# Patient Record
Sex: Female | Born: 1940 | Race: White | Hispanic: No | Marital: Single | Smoking: Former smoker
Health system: Southern US, Community
[De-identification: ages and names within clinical notes are randomized; demographics above are authoritative.]

## PROBLEM LIST (undated history)

## (undated) DIAGNOSIS — Z803 Family history of malignant neoplasm of breast: Secondary | ICD-10-CM

## (undated) DIAGNOSIS — J189 Pneumonia, unspecified organism: Secondary | ICD-10-CM

## (undated) DIAGNOSIS — Z170 Estrogen receptor positive status [ER+]: Principal | ICD-10-CM

## (undated) DIAGNOSIS — K219 Gastro-esophageal reflux disease without esophagitis: Secondary | ICD-10-CM

## (undated) DIAGNOSIS — M2550 Pain in unspecified joint: Secondary | ICD-10-CM

## (undated) DIAGNOSIS — F32A Depression, unspecified: Secondary | ICD-10-CM

## (undated) DIAGNOSIS — E785 Hyperlipidemia, unspecified: Secondary | ICD-10-CM

## (undated) DIAGNOSIS — M1990 Unspecified osteoarthritis, unspecified site: Secondary | ICD-10-CM

## (undated) DIAGNOSIS — H269 Unspecified cataract: Secondary | ICD-10-CM

## (undated) DIAGNOSIS — Z800 Family history of malignant neoplasm of digestive organs: Secondary | ICD-10-CM

## (undated) DIAGNOSIS — C50919 Malignant neoplasm of unspecified site of unspecified female breast: Secondary | ICD-10-CM

## (undated) DIAGNOSIS — Z8042 Family history of malignant neoplasm of prostate: Secondary | ICD-10-CM

## (undated) DIAGNOSIS — I10 Essential (primary) hypertension: Secondary | ICD-10-CM

## (undated) DIAGNOSIS — M549 Dorsalgia, unspecified: Secondary | ICD-10-CM

## (undated) DIAGNOSIS — C50412 Malignant neoplasm of upper-outer quadrant of left female breast: Principal | ICD-10-CM

## (undated) HISTORY — DX: Gastro-esophageal reflux disease without esophagitis: K21.9

## (undated) HISTORY — DX: Pneumonia, unspecified organism: J18.9

## (undated) HISTORY — PX: KIDNEY STONE SURGERY: SHX686

## (undated) HISTORY — DX: Estrogen receptor positive status (ER+): Z17.0

## (undated) HISTORY — PX: ABDOMINAL HYSTERECTOMY: SHX81

## (undated) HISTORY — DX: Unspecified osteoarthritis, unspecified site: M19.90

## (undated) HISTORY — DX: Pain in unspecified joint: M25.50

## (undated) HISTORY — DX: Unspecified cataract: H26.9

## (undated) HISTORY — DX: Family history of malignant neoplasm of prostate: Z80.42

## (undated) HISTORY — PX: TONSILLECTOMY: SUR1361

## (undated) HISTORY — PX: COLONOSCOPY: SHX174

## (undated) HISTORY — DX: Essential (primary) hypertension: I10

## (undated) HISTORY — PX: BREAST SURGERY: SHX581

## (undated) HISTORY — PX: AUGMENTATION MAMMAPLASTY: SUR837

## (undated) HISTORY — PX: BREAST LUMPECTOMY: SHX2

## (undated) HISTORY — DX: Hyperlipidemia, unspecified: E78.5

## (undated) HISTORY — DX: Family history of malignant neoplasm of digestive organs: Z80.0

## (undated) HISTORY — DX: Family history of malignant neoplasm of breast: Z80.3

## (undated) HISTORY — PX: UPPER GASTROINTESTINAL ENDOSCOPY: SHX188

## (undated) HISTORY — PX: EYE SURGERY: SHX253

## (undated) HISTORY — PX: BREAST EXCISIONAL BIOPSY: SUR124

## (undated) HISTORY — DX: Dorsalgia, unspecified: M54.9

## (undated) HISTORY — DX: Malignant neoplasm of upper-outer quadrant of left female breast: C50.412

## (undated) HISTORY — DX: Depression, unspecified: F32.A

## (undated) HISTORY — PX: POLYPECTOMY: SHX149

---

## 1978-06-05 HISTORY — PX: UTERINE FIBROID SURGERY: SHX826

## 2011-11-02 DIAGNOSIS — H35369 Drusen (degenerative) of macula, unspecified eye: Secondary | ICD-10-CM | POA: Diagnosis not present

## 2012-01-25 DIAGNOSIS — L723 Sebaceous cyst: Secondary | ICD-10-CM | POA: Diagnosis not present

## 2012-01-25 DIAGNOSIS — L820 Inflamed seborrheic keratosis: Secondary | ICD-10-CM | POA: Diagnosis not present

## 2012-01-25 DIAGNOSIS — L578 Other skin changes due to chronic exposure to nonionizing radiation: Secondary | ICD-10-CM | POA: Diagnosis not present

## 2012-01-25 DIAGNOSIS — D235 Other benign neoplasm of skin of trunk: Secondary | ICD-10-CM | POA: Diagnosis not present

## 2012-01-25 DIAGNOSIS — B359 Dermatophytosis, unspecified: Secondary | ICD-10-CM | POA: Diagnosis not present

## 2012-03-04 DIAGNOSIS — I10 Essential (primary) hypertension: Secondary | ICD-10-CM | POA: Diagnosis not present

## 2012-03-04 DIAGNOSIS — Z23 Encounter for immunization: Secondary | ICD-10-CM | POA: Diagnosis not present

## 2012-04-17 DIAGNOSIS — I10 Essential (primary) hypertension: Secondary | ICD-10-CM | POA: Diagnosis not present

## 2012-04-17 DIAGNOSIS — Z0289 Encounter for other administrative examinations: Secondary | ICD-10-CM | POA: Diagnosis not present

## 2012-04-17 LAB — LIPID PANEL
Cholesterol: 288 mg/dL — AB (ref 0–200)
HDL: 59 mg/dL (ref 35–70)
LDL Cholesterol: 217 mg/dL
Triglycerides: 62 mg/dL (ref 40–160)

## 2012-04-17 LAB — BASIC METABOLIC PANEL
BUN: 19 mg/dL (ref 4–21)
Creatinine: 0.8 mg/dL (ref 0.5–1.1)

## 2012-04-17 LAB — HEPATIC FUNCTION PANEL
ALT: 22 U/L (ref 7–35)
AST: 18 U/L (ref 13–35)

## 2012-05-14 ENCOUNTER — Ambulatory Visit (INDEPENDENT_AMBULATORY_CARE_PROVIDER_SITE_OTHER): Payer: Medicare Other | Admitting: Sports Medicine

## 2012-05-14 ENCOUNTER — Encounter: Payer: Self-pay | Admitting: Sports Medicine

## 2012-05-14 VITALS — BP 188/71 | HR 55 | Ht 64.0 in | Wt 215.0 lb

## 2012-05-14 DIAGNOSIS — Z299 Encounter for prophylactic measures, unspecified: Secondary | ICD-10-CM | POA: Insufficient documentation

## 2012-05-14 DIAGNOSIS — Z593 Problems related to living in residential institution: Secondary | ICD-10-CM | POA: Insufficient documentation

## 2012-05-14 DIAGNOSIS — E669 Obesity, unspecified: Secondary | ICD-10-CM | POA: Insufficient documentation

## 2012-05-14 DIAGNOSIS — E785 Hyperlipidemia, unspecified: Secondary | ICD-10-CM

## 2012-05-14 DIAGNOSIS — I10 Essential (primary) hypertension: Secondary | ICD-10-CM | POA: Insufficient documentation

## 2012-05-14 DIAGNOSIS — Z789 Other specified health status: Secondary | ICD-10-CM | POA: Insufficient documentation

## 2012-05-14 NOTE — Assessment & Plan Note (Signed)
Patient is up-to-date on colonoscopy, mammogram. Status post hysterectomy. Will come back for Medicare wellness exam.

## 2012-05-14 NOTE — Patient Instructions (Signed)
Madison Hardin I would like to make appointment for your wellness Medicare exam. I would also like to make another appointment at some point to discuss dietary and weight loss strategies.

## 2012-05-14 NOTE — Assessment & Plan Note (Signed)
Form was filled out. All blood work was already done, vaccinations done. She does need to come back 6 weeks prior to moving in for PPD.

## 2012-05-14 NOTE — Assessment & Plan Note (Signed)
Will discuss at future visits, she is open to all possibilities.

## 2012-05-14 NOTE — Progress Notes (Addendum)
Subjective:    CC: Establish care.   HPI:  Hypertension: Present chronically for a long time.  Usually well-controlled on telmisartan, denies headaches, chest pain, swelling, visual changes.  Domiciliary: She recently moved down from [place], is looking to move into a independent care facility.   Does need a form filled out. She also needs blood work, as well as a physical exam. Blood work has already been done, she will also need to come back 6 weeks before starting for a TB test.  Hyperlipidemia: She desires not to have this treated.  Obesity: Would like to discuss this at a future visit.  Past medical history, Surgical history, Family history, Social history, Allergies, and medications have been entered into the medical record, reviewed, and no changes needed.   Review of Systems: No headache, visual changes, nausea, vomiting, diarrhea, constipation, dizziness, abdominal pain, skin rash, fevers, chills, night sweats, swollen lymph nodes, weight loss, chest pain, body aches, joint swelling, muscle aches, shortness of breath, mood changes, visual or auditory hallucinations.  Objective:    General: Well Developed, obese, and in no acute distress.  Neuro: Alert and oriented x3, extra-ocular muscles intact.  HEENT: Normocephalic, atraumatic, pupils equal round reactive to light, neck supple, no masses, no lymphadenopathy, thyroid nonpalpable.  Skin: Warm and dry, no rashes noted.  Cardiac: Regular rate and rhythm, no murmurs rubs or gallops.  Respiratory: Clear to auscultation bilaterally. Not using accessory muscles, speaking in full sentences.  Abdominal: Soft, nontender, nondistended, positive bowel sounds, no masses, no organomegaly.  Musculoskeletal: Shoulder, elbow, wrist, hip, knee, ankle stable, and with full range of motion.  Form was filled out today.  Impression and Recommendations:    The patient was counselled, risk factors were discussed, anticipatory guidance given.

## 2012-05-14 NOTE — Assessment & Plan Note (Signed)
Blood pressure usually runs in the normal range on telmisartan. She will come back and we will recheck her blood pressure.

## 2012-05-14 NOTE — Assessment & Plan Note (Signed)
Patient desires that we do not treat her hyperlipidemia.

## 2012-06-06 ENCOUNTER — Encounter: Payer: Self-pay | Admitting: *Deleted

## 2012-06-06 ENCOUNTER — Encounter: Payer: Self-pay | Admitting: Sports Medicine

## 2012-06-06 ENCOUNTER — Ambulatory Visit (INDEPENDENT_AMBULATORY_CARE_PROVIDER_SITE_OTHER): Payer: Medicare Other | Admitting: Sports Medicine

## 2012-06-06 VITALS — BP 155/87 | HR 105 | Ht 64.0 in | Wt 213.0 lb

## 2012-06-06 DIAGNOSIS — L659 Nonscarring hair loss, unspecified: Secondary | ICD-10-CM | POA: Insufficient documentation

## 2012-06-06 DIAGNOSIS — Z0000 Encounter for general adult medical examination without abnormal findings: Secondary | ICD-10-CM | POA: Diagnosis not present

## 2012-06-06 DIAGNOSIS — Z299 Encounter for prophylactic measures, unspecified: Secondary | ICD-10-CM

## 2012-06-06 DIAGNOSIS — E785 Hyperlipidemia, unspecified: Secondary | ICD-10-CM

## 2012-06-06 DIAGNOSIS — E669 Obesity, unspecified: Secondary | ICD-10-CM

## 2012-06-06 DIAGNOSIS — I10 Essential (primary) hypertension: Secondary | ICD-10-CM

## 2012-06-06 NOTE — Progress Notes (Signed)
Subjective:    Madison Hardin is a 72 y.o. female who presents for Medicare Annual/Subsequent preventive examination.  Preventive Screening-Counseling & Management  Tobacco History  Smoking status  . Former Smoker  Smokeless tobacco  . Not on file     Problems Prior to Visit 1.   Current Problems (verified) Patient Active Problem List  Diagnosis  . Hypertension  . Hyperlipidemia  . Obesity  . Preventive measure  . Independent living facility as domicile    Medications Prior to Visit Current Outpatient Prescriptions on File Prior to Visit  Medication Sig Dispense Refill  . MICARDIS 40 MG tablet Take 20 mg by mouth daily.       Marland Kitchen omeprazole (PRILOSEC) 10 MG capsule Take 10 mg by mouth daily.        Current Medications (verified) Current Outpatient Prescriptions  Medication Sig Dispense Refill  . MICARDIS 40 MG tablet Take 20 mg by mouth daily.       Marland Kitchen omeprazole (PRILOSEC) 10 MG capsule Take 10 mg by mouth daily.         Allergies (verified) Review of patient's allergies indicates no known allergies.   PAST HISTORY  Family History Family History  Problem Relation Age of Onset  . Cancer Mother   . Cancer Father     Social History History  Substance Use Topics  . Smoking status: Former Games developer  . Smokeless tobacco: Not on file  . Alcohol Use: No     Are there smokers in your home (other than you)? No  Risk Factors Current exercise habits: Jogging in pool.  Dietary issues discussed: Yes   Cardiac risk factors: advanced age (older than 9 for men, 61 for women) and hypertension.  Depression Screen (Note: if answer to either of the following is "Yes", a more complete depression screening is indicated)   Over the past two weeks, have you felt down, depressed or hopeless? No  Over the past two weeks, have you felt little interest or pleasure in doing things? No  Have you lost interest or pleasure in daily life? No  Do you often feel hopeless? No  Do you  cry easily over simple problems? No  Activities of Daily Living In your present state of health, do you have any difficulty performing the following activities?:  Driving? No Managing money?  No Feeding yourself? No Getting from bed to chair? No Climbing a flight of stairs? No Preparing food and eating?: No Bathing or showering? No Getting dressed: No Getting to the toilet? No Using the toilet:No Moving around from place to place: No In the past year have you fallen or had a near fall?:No   Are you sexually active?  Yes  Do you have more than one partner?  No  Hearing Difficulties: No Do you often ask people to speak up or repeat themselves? No Do you experience ringing or noises in your ears? No Do you have difficulty understanding soft or whispered voices? No   Do you feel that you have a problem with memory? No  Do you often misplace items? No  Do you feel safe at home?  Yes  Cognitive Testing  Alert? Yes  Normal Appearance?Yes  Oriented to person? Yes  Place? Yes   Time? Yes  Recall of three objects?  Yes  Can perform simple calculations? Yes  Displays appropriate judgment?Yes  Can read the correct time from a watch face?Yes   Advanced Directives have been discussed with the patient? Yes, No intubation,  CPR.  List the Names of Other Physician/Practitioners you currently use: 1.    Indicate any recent Medical Services you may have received from other than [facility] providers in the past year (date may be approximate).  Immunization History  Administered Date(s) Administered  . Influenza Whole 02/04/2012  . Pneumococcal Polysaccharide 10/04/2006  . Td 02/04/2011    Screening Tests Health Maintenance  Topic Date Due  . Colonoscopy  07/06/1991  . Zostavax  07/05/2001  . Influenza Vaccine  02/03/2013  . Tetanus/tdap  02/03/2021  . Pneumococcal Polysaccharide Vaccine Age 65 And Over  Completed    All answers were reviewed with the patient and necessary referrals were  made:  Rodney Langton, MD   06/06/2012   History reviewed: allergies, current medications, past family history, past medical history, past social history, past surgical history and problem list  Review of Systems Pertinent items are noted in HPI.    Objective:     Vision by Snellen chart: right eye:20/20, left eye:20/20 with glasses in both eyes. Has ophtho.  Body mass index is 36.56 kg/(m^2). BP 155/87  Pulse 105  Ht 5\' 4"  (1.626 m)  Wt 213 lb (96.616 kg)  BMI 36.56 kg/m2  No exam performed today.      Assessment:     Healthy 72 year old female.     Plan:     During the course of the visit the patient was educated and counseled about appropriate screening and preventive services including:    Colorectal cancer screening  Nutrition counseling   Advanced directives: DNI/DNR  Diet review for nutrition referral? Yes ____  Not Indicated _x___   Patient Instructions (the written plan) was given to the patient.  Medicare Attestation I have personally reviewed: The patient's medical and social history Their use of alcohol, tobacco or illicit drugs Their current medications and supplements The patient's functional ability including ADLs,fall risks, home safety risks, cognitive, and hearing and visual impairment Diet and physical activities Evidence for depression or mood disorders  The patient's weight, height, BMI, and visual acuity have been recorded in the chart.  I have made referrals, counseling, and provided education to the patient based on review of the above and I have provided the patient with a written personalized care plan for preventive services.     Rodney Langton, MD   06/06/2012

## 2012-06-06 NOTE — Assessment & Plan Note (Addendum)
Patient desires not to treat this.

## 2012-06-06 NOTE — Assessment & Plan Note (Signed)
Will be needing a repeat colonoscopy in a year, we will go ahead and send the referral. Last colonoscopy was February 2010, also up-to-date on shingles vaccination done in April 2009.

## 2012-06-06 NOTE — Assessment & Plan Note (Signed)
Continue home exercise  

## 2012-06-06 NOTE — Assessment & Plan Note (Signed)
Home blood pressures are perfect. No changes in medication.

## 2012-06-06 NOTE — Assessment & Plan Note (Signed)
We'll try over-the-counter minoxidil.

## 2012-06-07 ENCOUNTER — Encounter: Payer: Self-pay | Admitting: Internal Medicine

## 2012-07-08 DIAGNOSIS — M779 Enthesopathy, unspecified: Secondary | ICD-10-CM | POA: Diagnosis not present

## 2012-07-08 DIAGNOSIS — M216X9 Other acquired deformities of unspecified foot: Secondary | ICD-10-CM | POA: Diagnosis not present

## 2012-07-08 DIAGNOSIS — M25473 Effusion, unspecified ankle: Secondary | ICD-10-CM | POA: Diagnosis not present

## 2012-07-08 DIAGNOSIS — M25476 Effusion, unspecified foot: Secondary | ICD-10-CM | POA: Diagnosis not present

## 2012-07-22 ENCOUNTER — Encounter: Payer: Medicare Other | Admitting: Internal Medicine

## 2012-10-29 ENCOUNTER — Encounter: Payer: Self-pay | Admitting: Internal Medicine

## 2012-12-11 DIAGNOSIS — H526 Other disorders of refraction: Secondary | ICD-10-CM | POA: Diagnosis not present

## 2012-12-11 DIAGNOSIS — H01009 Unspecified blepharitis unspecified eye, unspecified eyelid: Secondary | ICD-10-CM | POA: Diagnosis not present

## 2012-12-11 DIAGNOSIS — H2589 Other age-related cataract: Secondary | ICD-10-CM | POA: Diagnosis not present

## 2012-12-11 DIAGNOSIS — H47329 Drusen of optic disc, unspecified eye: Secondary | ICD-10-CM | POA: Diagnosis not present

## 2012-12-11 DIAGNOSIS — H353190 Nonexudative age-related macular degeneration, unspecified eye, stage unspecified: Secondary | ICD-10-CM | POA: Diagnosis not present

## 2012-12-25 ENCOUNTER — Ambulatory Visit (AMBULATORY_SURGERY_CENTER): Payer: Medicare Other | Admitting: *Deleted

## 2012-12-25 ENCOUNTER — Telehealth: Payer: Self-pay | Admitting: *Deleted

## 2012-12-25 VITALS — Ht 64.0 in | Wt 213.4 lb

## 2012-12-25 DIAGNOSIS — Z86010 Personal history of colonic polyps: Secondary | ICD-10-CM

## 2012-12-25 MED ORDER — MOVIPREP 100 G PO SOLR: 1.0000 | Freq: Once | ORAL | Status: DC

## 2012-12-25 NOTE — Progress Notes (Signed)
No egg or soy allergy. ewm No home 02 use. ewm Previous GI history with dr Madison Hardin in [place]. Pt states she has history of colon polyps. ewm No problems with past sedation. ewm Records release complete and to [facility] for pyrtle. ewm

## 2012-12-25 NOTE — Telephone Encounter (Signed)
Requested records from Dr. Vista Mink in [place].

## 2012-12-25 NOTE — Telephone Encounter (Signed)
Pt is scheduled for a colonoscopy on 01-08-13 at 900 am with Dr  Rhea Belton. She has had multiple colonoscopies in [place] with Dr Vista Mink and we need to obtain old records. Pt states she has history of colon polyps with last colon in 2010 she believes.  Records release complete and to [facility]. Marie pre visit

## 2013-01-06 ENCOUNTER — Encounter: Payer: Medicare Other | Admitting: Internal Medicine

## 2013-01-08 ENCOUNTER — Ambulatory Visit (AMBULATORY_SURGERY_CENTER): Payer: Medicare Other | Admitting: Internal Medicine

## 2013-01-08 ENCOUNTER — Encounter: Payer: Self-pay | Admitting: Internal Medicine

## 2013-01-08 VITALS — BP 125/72 | HR 58 | Temp 98.2°F | Resp 26 | Ht 64.0 in | Wt 213.0 lb

## 2013-01-08 DIAGNOSIS — Z1211 Encounter for screening for malignant neoplasm of colon: Secondary | ICD-10-CM

## 2013-01-08 DIAGNOSIS — D126 Benign neoplasm of colon, unspecified: Secondary | ICD-10-CM

## 2013-01-08 DIAGNOSIS — Z86010 Personal history of colonic polyps: Secondary | ICD-10-CM | POA: Diagnosis not present

## 2013-01-08 DIAGNOSIS — K219 Gastro-esophageal reflux disease without esophagitis: Secondary | ICD-10-CM | POA: Diagnosis not present

## 2013-01-08 DIAGNOSIS — I10 Essential (primary) hypertension: Secondary | ICD-10-CM | POA: Diagnosis not present

## 2013-01-08 DIAGNOSIS — E669 Obesity, unspecified: Secondary | ICD-10-CM | POA: Diagnosis not present

## 2013-01-08 MED ORDER — SODIUM CHLORIDE 0.9 % IV SOLN: 500.0000 mL | INTRAVENOUS | Status: DC

## 2013-01-08 NOTE — Patient Instructions (Addendum)

## 2013-01-08 NOTE — Op Note (Signed)
[facility] [address] [place], [address]   COLONOSCOPY PROCEDURE REPORT  PATIENT: Madison, Hardin  MR#: [id number] BIRTHDATE: 1941/04/18 , 72  yrs. old GENDER: Female ENDOSCOPIST: Beverley Fiedler, MD REFERRED BY: Monica Becton PROCEDURE DATE:  01/08/2013 PROCEDURE:   Colonoscopy with snare polypectomy First Screening Colonoscopy - Avg.  risk and is 50 yrs.  old or older - No.  Prior Negative Screening - Now for repeat screening. N/A  History of Adenoma - Now for follow-up colonoscopy & has been > or = to 3 yrs.  Yes hx of adenoma.  Has been 3 or more years since last colonoscopy.  Polyps Removed Today? Yes. ASA CLASS:   Class II INDICATIONS:elevated risk screening. MEDICATIONS: MAC sedation, administered by CRNA and propofol (Diprivan) 300mg  IV  DESCRIPTION OF PROCEDURE:   After the risks benefits and alternatives of the procedure were thoroughly explained, informed consent was obtained.  A digital rectal exam revealed no rectal mass.   The LB PFC-H190 [id number]  endoscope was introduced through the anus and advanced to the cecum, which was identified by both the appendix and ileocecal valve. No adverse events experienced. The quality of the prep was good, using MoviPrep  The instrument was then slowly withdrawn as the colon was fully examined.   COLON FINDINGS: A sessile polyp measuring 4 mm in size was found in the transverse colon.  A polypectomy was performed with a cold snare.  The resection was complete and the polyp tissue was completely retrieved.   A pedunculated polyp measuring 6 mm in size was found in the rectosigmoid colon.  A polypectomy was performed using snare cautery.  The resection was complete and the polyp tissue was completely retrieved. Retroflexed views revealed external hemorrhoids. The time to cecum=6 minutes 00 seconds. Withdrawal time=14 minutes 05 seconds.  The scope was withdrawn and the procedure completed. COMPLICATIONS:  There were no complications.  ENDOSCOPIC IMPRESSION: 1.   Sessile polyp measuring 4 mm in size was found in the transverse colon; polypectomy was performed with a cold snare 2.   Pedunculated polyp measuring 6 mm in size was found in the rectosigmoid colon; polypectomy was performed using snare cautery 3.   Moderate sized external hemorrhoids  RECOMMENDATIONS: 1.  Hold aspirin, aspirin products, and anti-inflammatory medication for 1 week. 2.  Await pathology results 3.  Repeat Colonoscopy in 5 years. 4.  You will receive a letter within 1-2 weeks with the results of your biopsy as well as final recommendations.  Please call my office if you have not received a letter after 3 weeks.   eSigned:  Beverley Fiedler, MD 01/08/2013 9:39 AM   cc: The Patient; Monica Becton    PATIENT NAME:  Madison, Hardin MR#: [id number]

## 2013-01-08 NOTE — Progress Notes (Signed)
Patient did not experience any of the following events: a burn prior to discharge; a fall within the facility; wrong site/side/patient/procedure/implant event; or a hospital transfer or hospital admission upon discharge from the facility. (G8907) Patient did not have preoperative order for IV antibiotic SSI prophylaxis. (G8918)  

## 2013-01-08 NOTE — Progress Notes (Signed)
NO EGG OR SOY ALLERGY. EWM 

## 2013-01-08 NOTE — Progress Notes (Signed)
Called to room to assist during endoscopic procedure.  Patient ID and intended procedure confirmed with present staff. Received instructions for my participation in the procedure from the performing physician. ewm 

## 2013-01-08 NOTE — Progress Notes (Signed)
Lidocaine-40mg IV prior to Propofol InductionPropofol given over incremental dosages 

## 2013-01-10 ENCOUNTER — Telehealth: Payer: Self-pay | Admitting: *Deleted

## 2013-01-10 NOTE — Telephone Encounter (Signed)
Left message

## 2013-01-14 ENCOUNTER — Encounter: Payer: Self-pay | Admitting: Internal Medicine

## 2013-03-03 DIAGNOSIS — Z23 Encounter for immunization: Secondary | ICD-10-CM | POA: Diagnosis not present

## 2013-07-10 ENCOUNTER — Telehealth: Payer: Self-pay

## 2013-07-10 ENCOUNTER — Encounter: Payer: Self-pay | Admitting: Sports Medicine

## 2013-07-10 ENCOUNTER — Ambulatory Visit (INDEPENDENT_AMBULATORY_CARE_PROVIDER_SITE_OTHER): Payer: Medicare Other | Admitting: Sports Medicine

## 2013-07-10 VITALS — BP 133/76 | HR 84 | Temp 97.9°F | Ht 64.0 in | Wt 217.0 lb

## 2013-07-10 DIAGNOSIS — I10 Essential (primary) hypertension: Secondary | ICD-10-CM | POA: Diagnosis not present

## 2013-07-10 DIAGNOSIS — N63 Unspecified lump in unspecified breast: Secondary | ICD-10-CM | POA: Diagnosis not present

## 2013-07-10 DIAGNOSIS — N6320 Unspecified lump in the left breast, unspecified quadrant: Secondary | ICD-10-CM

## 2013-07-10 DIAGNOSIS — D171 Benign lipomatous neoplasm of skin and subcutaneous tissue of trunk: Secondary | ICD-10-CM | POA: Insufficient documentation

## 2013-07-10 DIAGNOSIS — Z299 Encounter for prophylactic measures, unspecified: Secondary | ICD-10-CM

## 2013-07-10 DIAGNOSIS — Z1239 Encounter for other screening for malignant neoplasm of breast: Secondary | ICD-10-CM

## 2013-07-10 DIAGNOSIS — N644 Mastodynia: Secondary | ICD-10-CM

## 2013-07-10 MED ORDER — OMEPRAZOLE 20 MG PO CPDR: 20.0000 mg | DELAYED_RELEASE_CAPSULE | Freq: Every day | ORAL | Status: DC

## 2013-07-10 MED ORDER — TELMISARTAN 20 MG PO TABS: 20.0000 mg | ORAL_TABLET | Freq: Every day | ORAL | Status: DC

## 2013-07-10 NOTE — Progress Notes (Signed)
  Subjective:    CC: Followup  HPI: Breast pain: Present for several days now, she has noted a nodule in the left breast, this is tender to palpation, no trauma, no changes in activity, no constitutional symptoms, pain is moderate, persistent, she does not desire any analgesic medication.  Hypertension: Needs refills on medication. Stable.  GERD: Needs refill of omeprazole, stable.  Past medical history, Surgical history, Family history not pertinant except as noted below, Social history, Allergies, and medications have been entered into the medical record, reviewed, and no changes needed.   Review of Systems: No fevers, chills, night sweats, weight loss, chest pain, or shortness of breath.   Objective:    General: Well Developed, well nourished, and in no acute distress.  Neuro: Alert and oriented x3, extra-ocular muscles intact, sensation grossly intact.  HEENT: Normocephalic, atraumatic, pupils equal round reactive to light, neck supple, no masses, no lymphadenopathy, thyroid nonpalpable.  Skin: Warm and dry, no rashes. Cardiac: Regular rate and rhythm, no murmurs rubs or gallops, no lower extremity edema.  Respiratory: Clear to auscultation bilaterally. Not using accessory muscles, speaking in full sentences. Breasts: Unremarkable to inspection, there is a movable, well defined, spherical mass in the left breast at the 8:00 position approximately 3 cm from the nipple. This is tender to palpation. No overlying erythema or induration. Left Shoulder: Inspection reveals no abnormalities, atrophy or asymmetry. Palpation is normal with no tenderness over AC joint or bicipital groove. ROM is full in all planes. Rotator cuff strength normal throughout. No signs of impingement with negative Neer and Hawkin's tests, empty can sign. Speeds and Yergason's tests normal. No labral pathology noted with negative Obrien's, negative clunk and good stability. Normal scapular function observed. No  painful arc and no drop arm sign. No apprehension sign  Physical exam was conducted with a female nurse chaperone.  Impression and Recommendations:

## 2013-07-10 NOTE — Telephone Encounter (Signed)
Helen from Imaging called stated that they don't  Do the breast imaging and [place] downstairs so she  changed it to [facility] and she stated that they will contact the patient to let her know. Sion Reinders,CMA

## 2013-07-10 NOTE — Telephone Encounter (Signed)
Noted, thanks!

## 2013-07-10 NOTE — Telephone Encounter (Signed)
Orders placed.

## 2013-07-10 NOTE — Assessment & Plan Note (Signed)
Refilling

## 2013-07-10 NOTE — Assessment & Plan Note (Signed)
Patient will come back for complete physical once we get this breast lump figured out.

## 2013-07-10 NOTE — Assessment & Plan Note (Signed)
This is approximately 1.5 cm, well-circumscribed, and tender, is most likely represents fibrocystic changes. At this point we are going to obtain a diagnostic mammogram and likely possible ultrasound. She declines any analgesics.

## 2013-07-10 NOTE — Telephone Encounter (Signed)
Patient called and stated that she has not had a mammogram done in a while and [facility] can she get an order for Bilateral Acequia

## 2013-07-11 NOTE — Telephone Encounter (Signed)
Patient has been notified. Prudy Candy,CMA  

## 2013-07-17 ENCOUNTER — Encounter: Payer: Self-pay | Admitting: Sports Medicine

## 2013-07-17 ENCOUNTER — Ambulatory Visit (INDEPENDENT_AMBULATORY_CARE_PROVIDER_SITE_OTHER): Payer: Medicare Other | Admitting: Sports Medicine

## 2013-07-17 VITALS — BP 138/81 | HR 89 | Ht 64.0 in | Wt 214.0 lb

## 2013-07-17 DIAGNOSIS — Z79899 Other long term (current) drug therapy: Secondary | ICD-10-CM

## 2013-07-17 DIAGNOSIS — M1990 Unspecified osteoarthritis, unspecified site: Secondary | ICD-10-CM | POA: Insufficient documentation

## 2013-07-17 DIAGNOSIS — Z0000 Encounter for general adult medical examination without abnormal findings: Secondary | ICD-10-CM | POA: Diagnosis not present

## 2013-07-17 DIAGNOSIS — Z136 Encounter for screening for cardiovascular disorders: Secondary | ICD-10-CM

## 2013-07-17 DIAGNOSIS — Z299 Encounter for prophylactic measures, unspecified: Secondary | ICD-10-CM

## 2013-07-17 DIAGNOSIS — G4700 Insomnia, unspecified: Secondary | ICD-10-CM | POA: Insufficient documentation

## 2013-07-17 DIAGNOSIS — L659 Nonscarring hair loss, unspecified: Secondary | ICD-10-CM

## 2013-07-17 DIAGNOSIS — N6320 Unspecified lump in the left breast, unspecified quadrant: Secondary | ICD-10-CM

## 2013-07-17 DIAGNOSIS — I10 Essential (primary) hypertension: Secondary | ICD-10-CM

## 2013-07-17 DIAGNOSIS — M949 Disorder of cartilage, unspecified: Secondary | ICD-10-CM

## 2013-07-17 DIAGNOSIS — M899 Disorder of bone, unspecified: Secondary | ICD-10-CM

## 2013-07-17 MED ORDER — TRAZODONE HCL 50 MG PO TABS: 50.0000 mg | ORAL_TABLET | Freq: Every day | ORAL | Status: DC

## 2013-07-17 NOTE — Assessment & Plan Note (Signed)
Currently scheduled for diagnostic mammogram and likely ultrasound as well.

## 2013-07-17 NOTE — Assessment & Plan Note (Signed)
Return for custom orthotics 

## 2013-07-17 NOTE — Assessment & Plan Note (Signed)
Well controlled 

## 2013-07-17 NOTE — Assessment & Plan Note (Signed)
Medicare physical today. Return to see me in one year for subsequent Medicare physical.

## 2013-07-17 NOTE — Progress Notes (Signed)
Subjective:    Madison Hardin is a 73 y.o. female who presents for Medicare Annual/Subsequent preventive examination.  Insomnia: Sleep latency is normal, quality of sleep, significant difficulty staying asleep for more than 5 hours per night, does not wake up to urinate.  Hair loss: No improvement with minoxidil, desires dermatology referral.  Preventive Screening-Counseling & Management  Tobacco History  Smoking status  . Former Smoker  Smokeless tobacco  . Never Used     Problems Prior to Visit 1.   Current Problems (verified) Patient Active Problem List   Diagnosis Date Noted  . Mass of breast, left 07/10/2013  . Hair loss 06/06/2012  . Hypertension 05/14/2012  . Hyperlipidemia 05/14/2012  . Obesity 05/14/2012  . Preventive measure 05/14/2012  . Independent living facility as domicile 05/14/2012    Medications Prior to Visit Current Outpatient Prescriptions on File Prior to Visit  Medication Sig Dispense Refill  . b complex vitamins capsule Take 1 capsule by mouth daily.      . Cholecalciferol (VITAMIN D) 2000 UNITS CAPS Take 1 capsule by mouth daily.      Marland Kitchen Kelp 225 MCG TABS Take 1 tablet by mouth daily.      . Lutein 20 MG TABS Take 1 tablet by mouth daily.      . magnesium oxide (MAG-OX) 400 MG tablet Take 400 mg by mouth daily.      . Menaquinone-7 (VITAMIN K2) 100 MCG CAPS Take 1 capsule by mouth daily.      . minoxidil (ROGAINE) 2 % external solution Apply topically 2 (two) times daily.      . Omega-3 Fatty Acids (OMEGA III EPA+DHA PO) Take 1 each by mouth daily. 1500mg /750 mg and pt takes 1 1/2 teaspoonsful daily      . omeprazole (PRILOSEC) 20 MG capsule Take 1 capsule (20 mg total) by mouth daily.  90 capsule  3  . telmisartan (MICARDIS) 20 MG tablet Take 1 tablet (20 mg total) by mouth daily.  90 tablet  3  . zinc gluconate 50 MG tablet Take 50 mg by mouth daily.       No current facility-administered medications on file prior to visit.    Current  Medications (verified) Current Outpatient Prescriptions  Medication Sig Dispense Refill  . b complex vitamins capsule Take 1 capsule by mouth daily.      . Cholecalciferol (VITAMIN D) 2000 UNITS CAPS Take 1 capsule by mouth daily.      Marland Kitchen Kelp 225 MCG TABS Take 1 tablet by mouth daily.      . Lutein 20 MG TABS Take 1 tablet by mouth daily.      . magnesium oxide (MAG-OX) 400 MG tablet Take 400 mg by mouth daily.      . Menaquinone-7 (VITAMIN K2) 100 MCG CAPS Take 1 capsule by mouth daily.      . minoxidil (ROGAINE) 2 % external solution Apply topically 2 (two) times daily.      . Omega-3 Fatty Acids (OMEGA III EPA+DHA PO) Take 1 each by mouth daily. 1500mg /750 mg and pt takes 1 1/2 teaspoonsful daily      . omeprazole (PRILOSEC) 20 MG capsule Take 1 capsule (20 mg total) by mouth daily.  90 capsule  3  . telmisartan (MICARDIS) 20 MG tablet Take 1 tablet (20 mg total) by mouth daily.  90 tablet  3  . zinc gluconate 50 MG tablet Take 50 mg by mouth daily.       No current  facility-administered medications for this visit.     Allergies (verified) Review of patient's allergies indicates no known allergies.   PAST HISTORY  Family History Family History  Problem Relation Age of Onset  . Breast cancer Mother   . Prostate cancer Father   . Penile cancer Father   . Colon cancer Neg Hx   . Rectal cancer Neg Hx   . Stomach cancer Neg Hx   . Leukemia Paternal Grandmother     Social History History  Substance Use Topics  . Smoking status: Former Research scientist (life sciences)  . Smokeless tobacco: Never Used  . Alcohol Use: No     Are there smokers in your home (other than you)? No  Risk Factors Current exercise habits: The patient does not participate in regular exercise at present.  Dietary issues discussed: Yes   Cardiac risk factors: advanced age (older than 24 for men, 43 for women), dyslipidemia and hypertension.  Depression Screen (Note: if answer to either of the following is "Yes", a more  complete depression screening is indicated)   Over the past two weeks, have you felt down, depressed or hopeless? No  Over the past two weeks, have you felt little interest or pleasure in doing things? No  Have you lost interest or pleasure in daily life? No  Do you often feel hopeless? No  Do you cry easily over simple problems? No  Activities of Daily Living In your present state of health, do you have any difficulty performing the following activities?:  Driving? No Managing money?  No Feeding yourself? No Getting from bed to chair? NoNo exam performed today, No need. Climbing a flight of stairs? No Preparing food and eating?: No Bathing or showering? No Getting dressed: No Getting to the toilet? No Using the toilet:No Moving around from place to place: No In the past year have you fallen or had a near fall?:No   Are you sexually active?  No  Do you have more than one partner?  No  Hearing Difficulties: No Do you often ask people to speak up or repeat themselves? No Do you experience ringing or noises in your ears? No Do you have difficulty understanding soft or whispered voices? No   Do you feel that you have a problem with memory? No  Do you often misplace items? No  Do you feel safe at home?  Yes  Cognitive Testing  Alert? Yes  Normal Appearance?Yes  Oriented to person? Yes  Place? Yes   Time? Yes  Recall of three objects?  Yes  Can perform simple calculations? Yes  Displays appropriate judgment?Yes  Can read the correct time from a watch face?Yes   Advanced Directives have been discussed with the patient? Yes  List the Names of Other Physician/Practitioners you currently use: 1.    Indicate any recent Medical Services you may have received from other than [facility] providers in the past year (date may be approximate).  Immunization History  Administered Date(s) Administered  . Influenza Whole 02/04/2012, 03/03/2013  . Pneumococcal Polysaccharide-23 10/04/2006   . Td 02/04/2011    Screening Tests Health Maintenance  Topic Date Due  . Mammogram  07/06/1991  . Influenza Vaccine  01/03/2014  . Colonoscopy  01/08/2018  . Tetanus/tdap  02/03/2021  . Pneumococcal Polysaccharide Vaccine Age 87 And Over  Completed  . Zostavax  Completed    All answers were reviewed with the patient and necessary referrals were made:  Aundria Mems, MD   07/17/2013   History reviewed:  allergies, current medications, past family history, past medical history, past social history, past surgical history and problem list  Review of Systems Pertinent items are noted in HPI.    Objective:     Vision by Snellen chart: right eye:20/20, left eye:20/20  Body mass index is 36.72 kg/(m^2). BP 138/81  Pulse 89  Ht 5\' 4"  (1.626 m)  Wt 214 lb (97.07 kg)  BMI 36.72 kg/m2  General Appearance: Alert, well developed and well nourished, cooperative, no distress.  Head: Normocephalic, no obvious abnormality  Eyes: PERRL, EOM's intact, conjunctiva and corneas clear.  Nose: Nares symmetrical, septum midline, mucosa pink, clear watery discharge; no sinus tenderness  Throat: Lips, tongue, and mucosa are moist, pink, and intact; teeth intact  Neck: Supple, symmetrical, trachea midline, no adenopathy; thyroid: no enlargement, symmetric,no tenderness/mass/nodules; no carotid bruit, no JVD  Back: Symmetrical, no curvature, ROM normal, no CVA tenderness  Chest/Breast: No mass or tenderness  Lungs: Clear to auscultation bilaterally, respirations unlabored  Heart: Normal PMI, no lower extremity edema, regular rate & rhythm, S1 and S2 normal, no murmurs, rubs, or gallops. Abdomen: Soft, non-tender, bowel sounds active all four quadrants, no mass, or organomegaly  Musculoskeletal: All joints, extremities examined, non-tender and unremarkable.  Lymphatic: No adenopathy  Skin/Hair/Nails: Skin warm, dry, and intact, no rashes or abnormal dyspigmentation  Neurologic: Alert and  oriented x3, no cranial nerve deficits, normal strength and tone, gait steady   Twelve-lead EKG was reviewed, shows normal sinus rhythm with incomplete right bundle branch block.     Assessment:     Healthy 73 year old female     Plan:     During the course of the visit the patient was educated and counseled about appropriate screening and preventive services including:    -  Diet review for nutrition referral? Yes ____  Not Indicated __x__   Patient Instructions (the written plan) was given to the patient.  Medicare Attestation I have personally reviewed: The patient's medical and social history Their use of alcohol, tobacco or illicit drugs Their current medications and supplements The patient's functional ability including ADLs,fall risks, home safety risks, cognitive, and hearing and visual impairment Diet and physical activities Evidence for depression or mood disorders  The patient's weight, height, BMI, and visual acuity have been recorded in the chart.  I have made referrals, counseling, and provided education to the patient based on review of the above and I have provided the patient with a written personalized care plan for preventive services.     Aundria Mems, MD   07/17/2013

## 2013-07-17 NOTE — Assessment & Plan Note (Signed)
We are going to add a low dose trazodone.

## 2013-07-17 NOTE — Assessment & Plan Note (Signed)
No improvement with minoxidil. Referral to dermatology.

## 2013-07-21 ENCOUNTER — Encounter: Payer: Self-pay | Admitting: Sports Medicine

## 2013-07-21 ENCOUNTER — Ambulatory Visit (INDEPENDENT_AMBULATORY_CARE_PROVIDER_SITE_OTHER): Payer: Medicare Other

## 2013-07-21 ENCOUNTER — Ambulatory Visit (INDEPENDENT_AMBULATORY_CARE_PROVIDER_SITE_OTHER): Payer: Medicare Other | Admitting: Sports Medicine

## 2013-07-21 VITALS — BP 124/74 | HR 74 | Ht 64.0 in | Wt 213.0 lb

## 2013-07-21 DIAGNOSIS — M79652 Pain in left thigh: Secondary | ICD-10-CM

## 2013-07-21 DIAGNOSIS — M19019 Primary osteoarthritis, unspecified shoulder: Secondary | ICD-10-CM

## 2013-07-21 DIAGNOSIS — M25519 Pain in unspecified shoulder: Secondary | ICD-10-CM

## 2013-07-21 DIAGNOSIS — M79609 Pain in unspecified limb: Secondary | ICD-10-CM

## 2013-07-21 DIAGNOSIS — M47817 Spondylosis without myelopathy or radiculopathy, lumbosacral region: Secondary | ICD-10-CM | POA: Diagnosis not present

## 2013-07-21 DIAGNOSIS — M25511 Pain in right shoulder: Secondary | ICD-10-CM | POA: Insufficient documentation

## 2013-07-21 DIAGNOSIS — IMO0002 Reserved for concepts with insufficient information to code with codable children: Secondary | ICD-10-CM

## 2013-07-21 NOTE — Assessment & Plan Note (Signed)
Classic rotator cuff syndrome. X-rays. Formal physical therapy. Return in 4 weeks, consider injection if no better.

## 2013-07-21 NOTE — Assessment & Plan Note (Signed)
Symptoms are dull and predominantly nocturnal. I do think that they represent a left lumbar radiculitis. X-rays, formal physical therapy. Return to see me in one month, MRI if no better.

## 2013-07-21 NOTE — Progress Notes (Signed)
  Subjective:    CC: Shoulder and thigh pain  HPI: Right shoulder pain: Localized over the deltoid, worse with overhead activities, no trauma, moderate, persistent, no numbness or tingling going into the hands and fingertips.  Left thigh pain: localized over the vastus lateralis, does not go past the knee, moderate, persistent, there were no precipitating or palliating factors.  Past medical history, Surgical history, Family history not pertinant except as noted below, Social history, Allergies, and medications have been entered into the medical record, reviewed, and no changes needed.   Review of Systems: No fevers, chills, night sweats, weight loss, chest pain, or shortness of breath.   Objective:    General: Well Developed, well nourished, and in no acute distress.  Neuro: Alert and oriented x3, extra-ocular muscles intact, sensation grossly intact.  HEENT: Normocephalic, atraumatic, pupils equal round reactive to light, neck supple, no masses, no lymphadenopathy, thyroid nonpalpable.  Skin: Warm and dry, no rashes. Cardiac: Regular rate and rhythm, no murmurs rubs or gallops, no lower extremity edema.  Respiratory: Clear to auscultation bilaterally. Not using accessory muscles, speaking in full sentences. Right Shoulder: Inspection reveals no abnormalities, atrophy or asymmetry. Palpation is normal with no tenderness over AC joint or bicipital groove. ROM is full in all planes. Rotator cuff strength normal throughout. Positive Neer and Hawkin's tests, empty can sign. Speeds and Yergason's tests normal. No labral pathology noted with negative Obrien's, negative clunk and good stability. Normal scapular function observed. No painful arc and no drop arm sign. No apprehension sign Left Hip: ROM IR: 45 Deg, ER: 45 Deg, Flexion: 120 Deg, Extension: 100 Deg, Abduction: 45 Deg, Adduction: 45 Deg Strength IR: 5/5, ER: 5/5, Flexion: 5/5, Extension: 5/5, Abduction: 5/5, Adduction:  5/5 Pelvic alignment unremarkable to inspection and palpation. Standing hip rotation and gait without trendelenburg sign / unsteadiness. Greater trochanter without tenderness to palpation. No tenderness over piriformis. No pain with FABER or FADIR. No SI joint tenderness and normal minimal SI movement. Negative straight leg raise.  Impression and Recommendations:

## 2013-07-23 ENCOUNTER — Ambulatory Visit
Admission: RE | Admit: 2013-07-23 | Discharge: 2013-07-23 | Disposition: A | Discharge status: Home / Self Care | Payer: Medicare Other | Source: Ambulatory Visit | Attending: Sports Medicine | Admitting: Sports Medicine

## 2013-07-23 ENCOUNTER — Encounter: Payer: Self-pay | Admitting: Sports Medicine

## 2013-07-23 ENCOUNTER — Encounter: Payer: Self-pay | Admitting: *Deleted

## 2013-07-23 DIAGNOSIS — N6320 Unspecified lump in the left breast, unspecified quadrant: Secondary | ICD-10-CM

## 2013-07-23 DIAGNOSIS — N6489 Other specified disorders of breast: Secondary | ICD-10-CM | POA: Diagnosis not present

## 2013-07-28 ENCOUNTER — Encounter: Payer: Self-pay | Admitting: Sports Medicine

## 2013-07-28 ENCOUNTER — Ambulatory Visit (INDEPENDENT_AMBULATORY_CARE_PROVIDER_SITE_OTHER): Payer: Medicare Other | Admitting: Sports Medicine

## 2013-07-28 VITALS — BP 132/74 | HR 72 | Ht 64.0 in | Wt 213.0 lb

## 2013-07-28 DIAGNOSIS — M1990 Unspecified osteoarthritis, unspecified site: Secondary | ICD-10-CM | POA: Diagnosis not present

## 2013-07-28 NOTE — Progress Notes (Signed)
    Patient was fitted for a : standard, cushioned, semi-rigid orthotic. The orthotic was heated and afterward the patient stood on the orthotic blank positioned on the orthotic stand. The patient was positioned in subtalar neutral position and 10 degrees of ankle dorsiflexion in a weight bearing stance. After completion of molding, a stable base was applied to the orthotic blank. The blank was ground to a stable position for weight bearing. Size:10 Base: Blue EVA Additional Posting and Padding: None The patient ambulated these, and they were very comfortable.  I spent 40 minutes with this patient, greater than 50% was face-to-face time counseling regarding the below diagnosis.   

## 2013-07-28 NOTE — Assessment & Plan Note (Signed)
Custom orthotics as above.  Return as needed. 

## 2013-07-30 DIAGNOSIS — R21 Rash and other nonspecific skin eruption: Secondary | ICD-10-CM | POA: Diagnosis not present

## 2013-07-30 DIAGNOSIS — Z79899 Other long term (current) drug therapy: Secondary | ICD-10-CM | POA: Diagnosis not present

## 2013-08-04 ENCOUNTER — Other Ambulatory Visit: Payer: Self-pay | Admitting: Sports Medicine

## 2013-08-04 DIAGNOSIS — Z79899 Other long term (current) drug therapy: Secondary | ICD-10-CM | POA: Diagnosis not present

## 2013-08-04 DIAGNOSIS — R21 Rash and other nonspecific skin eruption: Secondary | ICD-10-CM | POA: Diagnosis not present

## 2013-08-04 DIAGNOSIS — I10 Essential (primary) hypertension: Secondary | ICD-10-CM | POA: Diagnosis not present

## 2013-08-04 DIAGNOSIS — L659 Nonscarring hair loss, unspecified: Secondary | ICD-10-CM | POA: Diagnosis not present

## 2013-08-04 LAB — HEMOGLOBIN A1C
Hgb A1c MFr Bld: 5.6 % (ref <5.7)
Mean Plasma Glucose: 114 mg/dL (ref <117)

## 2013-08-04 LAB — CBC
HCT: 41.7 % (ref 36.0–46.0)
Hemoglobin: 13.9 g/dL (ref 12.0–15.0)
MCH: 29.8 pg (ref 26.0–34.0)
MCHC: 33.3 g/dL (ref 30.0–36.0)
MCV: 89.3 fL (ref 78.0–100.0)
Platelets: 262 K/uL (ref 150–400)
RBC: 4.67 MIL/uL (ref 3.87–5.11)
RDW: 14.5 % (ref 11.5–15.5)
WBC: 6.5 K/uL (ref 4.0–10.5)

## 2013-08-05 DIAGNOSIS — M67919 Unspecified disorder of synovium and tendon, unspecified shoulder: Secondary | ICD-10-CM | POA: Diagnosis not present

## 2013-08-05 DIAGNOSIS — M719 Bursopathy, unspecified: Secondary | ICD-10-CM | POA: Diagnosis not present

## 2013-08-05 DIAGNOSIS — M6281 Muscle weakness (generalized): Secondary | ICD-10-CM | POA: Diagnosis not present

## 2013-08-05 LAB — COMPREHENSIVE METABOLIC PANEL
ALT: 14 U/L (ref 0–35)
AST: 17 U/L (ref 0–37)
Albumin: 4.1 g/dL (ref 3.5–5.2)
Alkaline Phosphatase: 58 U/L (ref 39–117)
BUN: 18 mg/dL (ref 6–23)
Potassium: 4.6 mEq/L (ref 3.5–5.3)
Sodium: 143 mEq/L (ref 135–145)

## 2013-08-05 LAB — COMPREHENSIVE METABOLIC PANEL WITH GFR
CO2: 25 meq/L (ref 19–32)
Calcium: 9.3 mg/dL (ref 8.4–10.5)
Chloride: 105 meq/L (ref 96–112)
Creat: 0.68 mg/dL (ref 0.50–1.10)
Glucose, Bld: 84 mg/dL (ref 70–99)
Total Bilirubin: 0.5 mg/dL (ref 0.2–1.2)
Total Protein: 6.1 g/dL (ref 6.0–8.3)

## 2013-08-05 LAB — LIPID PANEL
Cholesterol: 260 mg/dL — ABNORMAL HIGH (ref 0–200)
HDL: 50 mg/dL (ref >39)
LDL Cholesterol: 198 mg/dL — ABNORMAL HIGH (ref 0–99)
Total CHOL/HDL Ratio: 5.2 Ratio
Triglycerides: 60 mg/dL (ref <150)
VLDL: 12 mg/dL (ref 0–40)

## 2013-08-05 LAB — TSH: TSH: 1.74 u[IU]/mL (ref 0.350–4.500)

## 2013-08-05 LAB — VITAMIN D 25 HYDROXY (VIT D DEFICIENCY, FRACTURES): Vit D, 25-Hydroxy: 65 ng/mL (ref 30–89)

## 2013-08-08 DIAGNOSIS — M67919 Unspecified disorder of synovium and tendon, unspecified shoulder: Secondary | ICD-10-CM | POA: Diagnosis not present

## 2013-08-08 DIAGNOSIS — M6281 Muscle weakness (generalized): Secondary | ICD-10-CM | POA: Diagnosis not present

## 2013-08-12 DIAGNOSIS — M67919 Unspecified disorder of synovium and tendon, unspecified shoulder: Secondary | ICD-10-CM | POA: Diagnosis not present

## 2013-08-12 DIAGNOSIS — M6281 Muscle weakness (generalized): Secondary | ICD-10-CM | POA: Diagnosis not present

## 2013-08-15 DIAGNOSIS — M6281 Muscle weakness (generalized): Secondary | ICD-10-CM | POA: Diagnosis not present

## 2013-08-15 DIAGNOSIS — M67919 Unspecified disorder of synovium and tendon, unspecified shoulder: Secondary | ICD-10-CM | POA: Diagnosis not present

## 2013-08-15 DIAGNOSIS — M719 Bursopathy, unspecified: Secondary | ICD-10-CM | POA: Diagnosis not present

## 2013-08-22 DIAGNOSIS — M67919 Unspecified disorder of synovium and tendon, unspecified shoulder: Secondary | ICD-10-CM | POA: Diagnosis not present

## 2013-08-22 DIAGNOSIS — M6281 Muscle weakness (generalized): Secondary | ICD-10-CM | POA: Diagnosis not present

## 2013-08-22 DIAGNOSIS — M719 Bursopathy, unspecified: Secondary | ICD-10-CM | POA: Diagnosis not present

## 2013-08-26 DIAGNOSIS — M67919 Unspecified disorder of synovium and tendon, unspecified shoulder: Secondary | ICD-10-CM | POA: Diagnosis not present

## 2013-08-26 DIAGNOSIS — M6281 Muscle weakness (generalized): Secondary | ICD-10-CM | POA: Diagnosis not present

## 2013-08-29 DIAGNOSIS — M719 Bursopathy, unspecified: Secondary | ICD-10-CM | POA: Diagnosis not present

## 2013-08-29 DIAGNOSIS — M67919 Unspecified disorder of synovium and tendon, unspecified shoulder: Secondary | ICD-10-CM | POA: Diagnosis not present

## 2013-08-29 DIAGNOSIS — M6281 Muscle weakness (generalized): Secondary | ICD-10-CM | POA: Diagnosis not present

## 2013-12-16 ENCOUNTER — Encounter: Payer: Self-pay | Admitting: Sports Medicine

## 2013-12-16 ENCOUNTER — Ambulatory Visit (INDEPENDENT_AMBULATORY_CARE_PROVIDER_SITE_OTHER): Payer: Medicare Other | Admitting: Sports Medicine

## 2013-12-16 VITALS — BP 143/88 | HR 81 | Wt 186.0 lb

## 2013-12-16 DIAGNOSIS — M79652 Pain in left thigh: Secondary | ICD-10-CM

## 2013-12-16 DIAGNOSIS — M79609 Pain in unspecified limb: Secondary | ICD-10-CM | POA: Diagnosis not present

## 2013-12-16 DIAGNOSIS — M25511 Pain in right shoulder: Secondary | ICD-10-CM

## 2013-12-16 MED ORDER — GLUCOSAMINE-CHONDROITIN 750-600 MG PO TABS
ORAL_TABLET | ORAL | Status: DC
Start: 2013-12-16 — End: 2015-03-24

## 2013-12-16 MED ORDER — DEVILS CLAW ROOT POWD: Status: DC

## 2013-12-16 NOTE — Assessment & Plan Note (Addendum)
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 She does not desire to proceed with MRI or epidurals. We can certainly try glucosamine, chondroitin, as well as devils claw. She would be amenable to using the supplements, and they do have some evidence for musculoskeletal pain.

## 2013-12-16 NOTE — Assessment & Plan Note (Signed)
Not painful enough for injection, happy with results so far.

## 2013-12-16 NOTE — Progress Notes (Signed)
  Subjective:    CC: Followup  HPI: Shoulder pain: Essentially resolved.  Back pain: Essentially resolved.  Left groin pain: Present for several weeks, worse with hip flexion, moderate, persistent without radiation.  Past medical history, Surgical history, Family history not pertinant except as noted below, Social history, Allergies, and medications have been entered into the medical record, reviewed, and no changes needed.   Review of Systems: No fevers, chills, night sweats, weight loss, chest pain, or shortness of breath.   Objective:    General: Well Developed, well nourished, and in no acute distress.  Neuro: Alert and oriented x3, extra-ocular muscles intact, sensation grossly intact.  HEENT: Normocephalic, atraumatic, pupils equal round reactive to light, neck supple, no masses, no lymphadenopathy, thyroid nonpalpable.  Skin: Warm and dry, no rashes. Cardiac: Regular rate and rhythm, no murmurs rubs or gallops, no lower extremity edema.  Respiratory: Clear to auscultation bilaterally. Not using accessory muscles, speaking in full sentences. Left Hip: ROM IR: 60 Deg, ER: 60 Deg, Flexion: 120 Deg, Extension: 100 Deg, Abduction: 45 Deg, Adduction: 45 Deg Strength IR: 5/5, ER: 5/5, Flexion: 5/5, Extension: 5/5, Abduction: 5/5, Adduction: 5/5, resisted hip flexion reproduces pain. Pelvic alignment unremarkable to inspection and palpation. Standing hip rotation and gait without trendelenburg / unsteadiness. Greater trochanter without tenderness to palpation. No tenderness over piriformis. No SI joint tenderness and normal minimal SI movement.  Impression and Recommendations:

## 2013-12-30 ENCOUNTER — Encounter: Payer: Self-pay | Admitting: Sports Medicine

## 2013-12-31 MED ORDER — OMEPRAZOLE 10 MG PO CPDR: 10.0000 mg | DELAYED_RELEASE_CAPSULE | Freq: Every day | ORAL | Status: DC

## 2014-01-27 DIAGNOSIS — H47329 Drusen of optic disc, unspecified eye: Secondary | ICD-10-CM | POA: Diagnosis not present

## 2014-01-27 DIAGNOSIS — H526 Other disorders of refraction: Secondary | ICD-10-CM | POA: Diagnosis not present

## 2014-01-27 DIAGNOSIS — H353190 Nonexudative age-related macular degeneration, unspecified eye, stage unspecified: Secondary | ICD-10-CM | POA: Diagnosis not present

## 2014-01-27 DIAGNOSIS — H2589 Other age-related cataract: Secondary | ICD-10-CM | POA: Diagnosis not present

## 2014-01-27 DIAGNOSIS — H01009 Unspecified blepharitis unspecified eye, unspecified eyelid: Secondary | ICD-10-CM | POA: Diagnosis not present

## 2014-03-05 DIAGNOSIS — Z23 Encounter for immunization: Secondary | ICD-10-CM | POA: Diagnosis not present

## 2014-08-05 ENCOUNTER — Other Ambulatory Visit: Payer: Self-pay

## 2014-08-05 DIAGNOSIS — I10 Essential (primary) hypertension: Secondary | ICD-10-CM

## 2014-08-05 MED ORDER — TELMISARTAN 20 MG PO TABS: 20.0000 mg | ORAL_TABLET | Freq: Every day | ORAL | Status: DC

## 2014-11-13 DIAGNOSIS — L814 Other melanin hyperpigmentation: Secondary | ICD-10-CM | POA: Diagnosis not present

## 2014-11-13 DIAGNOSIS — L820 Inflamed seborrheic keratosis: Secondary | ICD-10-CM | POA: Diagnosis not present

## 2015-01-21 ENCOUNTER — Ambulatory Visit (INDEPENDENT_AMBULATORY_CARE_PROVIDER_SITE_OTHER): Payer: Medicare Other | Admitting: Sports Medicine

## 2015-01-21 ENCOUNTER — Encounter: Payer: Self-pay | Admitting: Sports Medicine

## 2015-01-21 ENCOUNTER — Telehealth: Payer: Self-pay

## 2015-01-21 VITALS — BP 177/91 | HR 96 | Ht 64.0 in | Wt 239.0 lb

## 2015-01-21 DIAGNOSIS — R011 Cardiac murmur, unspecified: Secondary | ICD-10-CM | POA: Diagnosis not present

## 2015-01-21 DIAGNOSIS — I10 Essential (primary) hypertension: Secondary | ICD-10-CM

## 2015-01-21 DIAGNOSIS — F32A Depression, unspecified: Secondary | ICD-10-CM | POA: Insufficient documentation

## 2015-01-21 DIAGNOSIS — E669 Obesity, unspecified: Secondary | ICD-10-CM | POA: Diagnosis not present

## 2015-01-21 DIAGNOSIS — F329 Major depressive disorder, single episode, unspecified: Secondary | ICD-10-CM

## 2015-01-21 MED ORDER — TELMISARTAN 20 MG PO TABS: 20.0000 mg | ORAL_TABLET | Freq: Every day | ORAL | Status: DC

## 2015-01-21 MED ORDER — BUPROPION HCL ER (XL) 150 MG PO TB24: 150.0000 mg | ORAL_TABLET | ORAL | Status: DC

## 2015-01-21 MED ORDER — PHENTERMINE HCL 37.5 MG PO TABS: ORAL_TABLET | ORAL | Status: DC

## 2015-01-21 NOTE — Assessment & Plan Note (Signed)
Adding Wellbutrin, PHQ9 at each visit.

## 2015-01-21 NOTE — Telephone Encounter (Signed)
I can certainly do that, I didn't know she wants to proceed with the Wellbutrin. I will add 150 mg.

## 2015-01-21 NOTE — Telephone Encounter (Signed)
PATIENT STATED THAT DURING HER VISIT TODAY SHE WAS UNDER THE IMPRESSION THAT PCP WAS GOING TO CALL IN A SCRIPT FOR [place] TO BE SENT TO [facility]. PLEASE ADVISE WETHER OR THIS WILL BE CALLED IN. Calen Posch,CMA

## 2015-01-21 NOTE — Progress Notes (Addendum)
  Subjective:    CC: follow-up  HPI: Hypertension: Has run out of medicine, has noted her blood pressure started to drift upward as she has gained weight.  No chest pain, shortness of breath, visual changes.  Obesity: Worsening decrease in exercise tolerance, again, no chest pain, she did have a negative treadmill stress test sometime ago, no orthopnea or paroxysmal nocturnal dyspnea. Amenable to try some weight loss medication.  Depression: Does not want to start anything but admits that she feels somewhat down and sad. No suicidal or homicidal ideation, severe difficulty sleeping, poor energy, moderate change in appetite, and mild depressed mood.  Elevated coronary artery calcium: Had a CT calcium scoring sometime ago, score was approximately 7, wondering if I can order another test for her. She understands that this may not be covered by her insurance.  Past medical history, Surgical history, Family history not pertinant except as noted below, Social history, Allergies, and medications have been entered into the medical record, reviewed, and no changes needed.   Review of Systems: No fevers, chills, night sweats, weight loss, chest pain, or shortness of breath.   Objective:    General: Well Developed, well nourished, and in no acute distress.  Neuro: Alert and oriented x3, extra-ocular muscles intact, sensation grossly intact.  HEENT: Normocephalic, atraumatic, pupils equal round reactive to light, neck supple, no masses, no lymphadenopathy, thyroid nonpalpable.  Skin: Warm and dry, no rashes. Cardiac: Regular rate and rhythm, no lower extreme edema, no rubs or gallops, 2/6 systolic ejection murmur heard best in the left second parasternal intercostal space Respiratory: Clear to auscultation bilaterally. Not using accessory muscles, speaking in full sentences.  Impression and Recommendations:    I spent 40 minutes with this patient, greater than 50% was face-to-face time counseling  regarding the above diagnoses

## 2015-01-21 NOTE — Assessment & Plan Note (Signed)
Out of Micardis, refilling medication.

## 2015-01-21 NOTE — Telephone Encounter (Signed)
PATIENT HAS BEEN INFORMED. Madison Hardin,CMA  

## 2015-01-21 NOTE — Assessment & Plan Note (Signed)
Sounds to be aortic stenosis, combined with decreasing exercise tolerance we do need an echocardiogram. Patient is also requesting CT coronary artery calcium score. I will order this however she understands that this may not be covered. Blood work last year including CBC and TSH were normal

## 2015-01-21 NOTE — Assessment & Plan Note (Signed)
We will discuss this further at future visit, if we do start antidepressive it will probably be Wellbutrin as this is weight negative.

## 2015-01-21 NOTE — Assessment & Plan Note (Signed)
Starting phentermine, patient will hold off on starting the medication until we have results from her echocardiogram as well as her restarting her Micardis.

## 2015-01-25 ENCOUNTER — Other Ambulatory Visit: Payer: Self-pay

## 2015-01-25 ENCOUNTER — Ambulatory Visit (HOSPITAL_BASED_OUTPATIENT_CLINIC_OR_DEPARTMENT_OTHER): Payer: Medicare Other

## 2015-01-25 DIAGNOSIS — Z8249 Family history of ischemic heart disease and other diseases of the circulatory system: Secondary | ICD-10-CM | POA: Diagnosis not present

## 2015-01-25 DIAGNOSIS — E785 Hyperlipidemia, unspecified: Secondary | ICD-10-CM | POA: Diagnosis not present

## 2015-01-25 DIAGNOSIS — I340 Nonrheumatic mitral (valve) insufficiency: Secondary | ICD-10-CM | POA: Diagnosis not present

## 2015-01-25 DIAGNOSIS — R011 Cardiac murmur, unspecified: Secondary | ICD-10-CM | POA: Diagnosis not present

## 2015-01-25 DIAGNOSIS — Z87891 Personal history of nicotine dependence: Secondary | ICD-10-CM | POA: Diagnosis not present

## 2015-01-25 DIAGNOSIS — I10 Essential (primary) hypertension: Secondary | ICD-10-CM | POA: Diagnosis not present

## 2015-01-26 ENCOUNTER — Encounter (HOSPITAL_COMMUNITY): Payer: Self-pay

## 2015-01-26 ENCOUNTER — Ambulatory Visit (HOSPITAL_COMMUNITY)
Admission: RE | Admit: 2015-01-26 | Discharge: 2015-01-26 | Disposition: A | Discharge status: Home / Self Care | Payer: Medicare Other | Source: Ambulatory Visit | Attending: Sports Medicine | Admitting: Sports Medicine

## 2015-01-26 DIAGNOSIS — E785 Hyperlipidemia, unspecified: Secondary | ICD-10-CM | POA: Insufficient documentation

## 2015-01-26 DIAGNOSIS — I340 Nonrheumatic mitral (valve) insufficiency: Secondary | ICD-10-CM | POA: Insufficient documentation

## 2015-01-26 DIAGNOSIS — Z87891 Personal history of nicotine dependence: Secondary | ICD-10-CM | POA: Insufficient documentation

## 2015-01-26 DIAGNOSIS — R011 Cardiac murmur, unspecified: Secondary | ICD-10-CM | POA: Diagnosis not present

## 2015-01-26 DIAGNOSIS — Z8249 Family history of ischemic heart disease and other diseases of the circulatory system: Secondary | ICD-10-CM | POA: Insufficient documentation

## 2015-01-26 DIAGNOSIS — I10 Essential (primary) hypertension: Secondary | ICD-10-CM | POA: Insufficient documentation

## 2015-01-27 ENCOUNTER — Telehealth: Payer: Self-pay

## 2015-01-27 NOTE — Telephone Encounter (Signed)
Patient wants to know that now she has had the Echo done is it on if she starts taking the phentermine? Madison Hardin,CMA

## 2015-01-28 NOTE — Telephone Encounter (Signed)
Yes, phentermine is okay.

## 2015-01-28 NOTE — Telephone Encounter (Signed)
SPOKE TO PATIENT ADVISED HER OF THIS INFORMATION. Madison Hardin,CMA

## 2015-02-22 ENCOUNTER — Ambulatory Visit (INDEPENDENT_AMBULATORY_CARE_PROVIDER_SITE_OTHER): Payer: Medicare Other | Admitting: Sports Medicine

## 2015-02-22 ENCOUNTER — Encounter: Payer: Self-pay | Admitting: Sports Medicine

## 2015-02-22 VITALS — BP 164/94 | HR 104 | Ht 64.0 in | Wt 228.0 lb

## 2015-02-22 DIAGNOSIS — E669 Obesity, unspecified: Secondary | ICD-10-CM | POA: Diagnosis not present

## 2015-02-22 DIAGNOSIS — I10 Essential (primary) hypertension: Secondary | ICD-10-CM

## 2015-02-22 MED ORDER — GLUCOSE BLOOD VI STRP: ORAL_STRIP | Status: DC

## 2015-02-22 MED ORDER — PHENTERMINE HCL 37.5 MG PO TABS: ORAL_TABLET | ORAL | Status: DC

## 2015-02-22 NOTE — Assessment & Plan Note (Signed)
Blood pressure is elevated here, the patient tells me that her home readings are in the 19J to 478 systolic.

## 2015-02-22 NOTE — Assessment & Plan Note (Signed)
Fantastic weight loss after the first month. Refilling phentermine. Return in one month.

## 2015-02-22 NOTE — Progress Notes (Signed)
  Subjective:    CC: Follow-up  HPI: Hypertension: Elevated, no headaches, visual changes, chest pain, she tells me her home blood pressure readings are in the 27C to 623 systolic.  Obesity: 11 pound weight loss.  Past medical history, Surgical history, Family history not pertinant except as noted below, Social history, Allergies, and medications have been entered into the medical record, reviewed, and no changes needed.   Review of Systems: No fevers, chills, night sweats, weight loss, chest pain, or shortness of breath.   Objective:    General: Well Developed, well nourished, and in no acute distress.  Neuro: Alert and oriented x3, extra-ocular muscles intact, sensation grossly intact.  HEENT: Normocephalic, atraumatic, pupils equal round reactive to light, neck supple, no masses, no lymphadenopathy, thyroid nonpalpable.  Skin: Warm and dry, no rashes. Cardiac: Regular rate and rhythm, no murmurs rubs or gallops, no lower extremity edema.  Respiratory: Clear to auscultation bilaterally. Not using accessory muscles, speaking in full sentences.  Impression and Recommendations:

## 2015-03-24 ENCOUNTER — Encounter: Payer: Self-pay | Admitting: Sports Medicine

## 2015-03-24 ENCOUNTER — Ambulatory Visit (INDEPENDENT_AMBULATORY_CARE_PROVIDER_SITE_OTHER): Payer: Medicare Other | Admitting: Sports Medicine

## 2015-03-24 VITALS — BP 134/94 | HR 97 | Ht 64.0 in | Wt 226.0 lb

## 2015-03-24 DIAGNOSIS — E669 Obesity, unspecified: Secondary | ICD-10-CM

## 2015-03-24 DIAGNOSIS — I10 Essential (primary) hypertension: Secondary | ICD-10-CM

## 2015-03-24 MED ORDER — PHENTERMINE HCL 37.5 MG PO TABS: ORAL_TABLET | ORAL | Status: DC

## 2015-03-24 MED ORDER — SENNOSIDES-DOCUSATE SODIUM 8.6-50 MG PO TABS: 2.0000 | ORAL_TABLET | Freq: Two times a day (BID) | ORAL | Status: DC

## 2015-03-24 NOTE — Assessment & Plan Note (Signed)
10 pound weight loss first month, only 2 pounds this month. Switching to one half tab twice a day, and she does need some Senokot-S.

## 2015-03-24 NOTE — Progress Notes (Signed)
  Subjective:    CC: Follow-up  HPI: Obesity: Only had a 1 pound weight loss after the last month. Has been fairly constipated. Using her medication at 6 PM.  Past medical history, Surgical history, Family history not pertinant except as noted below, Social history, Allergies, and medications have been entered into the medical record, reviewed, and no changes needed.   Review of Systems: No fevers, chills, night sweats, weight loss, chest pain, or shortness of breath.   Objective:    General: Well Developed, well nourished, and in no acute distress.  Neuro: Alert and oriented x3, extra-ocular muscles intact, sensation grossly intact.  HEENT: Normocephalic, atraumatic, pupils equal round reactive to light, neck supple, no masses, no lymphadenopathy, thyroid nonpalpable.  Skin: Warm and dry, no rashes. Cardiac: Regular rate and rhythm, no murmurs rubs or gallops, no lower extremity edema.  Respiratory: Clear to auscultation bilaterally. Not using accessory muscles, speaking in full sentences.  Impression and Recommendations:

## 2015-04-19 ENCOUNTER — Encounter: Payer: Self-pay | Admitting: Sports Medicine

## 2015-04-22 ENCOUNTER — Ambulatory Visit (INDEPENDENT_AMBULATORY_CARE_PROVIDER_SITE_OTHER): Payer: Medicare Other | Admitting: Sports Medicine

## 2015-04-22 ENCOUNTER — Encounter: Payer: Self-pay | Admitting: Sports Medicine

## 2015-04-22 VITALS — BP 169/70 | HR 77 | Ht 64.0 in | Wt 226.0 lb

## 2015-04-22 DIAGNOSIS — I10 Essential (primary) hypertension: Secondary | ICD-10-CM | POA: Diagnosis not present

## 2015-04-22 DIAGNOSIS — E669 Obesity, unspecified: Secondary | ICD-10-CM

## 2015-04-22 MED ORDER — PHENDIMETRAZINE TARTRATE ER 105 MG PO CP24
1.0000 | ORAL_CAPSULE | Freq: Every day | ORAL | Status: DC
Start: 2015-04-22 — End: 2016-01-07

## 2015-04-22 MED ORDER — OMEPRAZOLE 10 MG PO CPDR: 10.0000 mg | DELAYED_RELEASE_CAPSULE | Freq: Every day | ORAL | Status: DC

## 2015-04-22 MED ORDER — TELMISARTAN 20 MG PO TABS: 10.0000 mg | ORAL_TABLET | Freq: Every day | ORAL | Status: DC

## 2015-04-22 MED ORDER — EXENATIDE ER 2 MG ~~LOC~~ SRER: 2.0000 mg | SUBCUTANEOUS | Status: DC

## 2015-04-22 MED ORDER — TOPIRAMATE 50 MG PO TABS: ORAL_TABLET | ORAL | Status: DC

## 2015-04-22 NOTE — Assessment & Plan Note (Signed)
Good weight loss initially but has stayed the same weight for the past couple of months, switching to phendimetrazine and adding Topamax and Bydureon

## 2015-04-22 NOTE — Progress Notes (Signed)
  Subjective:    CC: Weight check  HPI: Good weight loss on the first month on phentermine but over the past 2 months she has remained the same. She is understandably frustrated.  Past medical history, Surgical history, Family history not pertinant except as noted below, Social history, Allergies, and medications have been entered into the medical record, reviewed, and no changes needed.   Review of Systems: No fevers, chills, night sweats, weight loss, chest pain, or shortness of breath.   Objective:    General: Well Developed, well nourished, and in no acute distress.  Neuro: Alert and oriented x3, extra-ocular muscles intact, sensation grossly intact.  HEENT: Normocephalic, atraumatic, pupils equal round reactive to light, neck supple, no masses, no lymphadenopathy, thyroid nonpalpable.  Skin: Warm and dry, no rashes. Cardiac: Regular rate and rhythm, no murmurs rubs or gallops, no lower extremity edema.  Respiratory: Clear to auscultation bilaterally. Not using accessory muscles, speaking in full sentences.  Impression and Recommendations:    I spent 25 minutes with this patient, greater than 50% was face-to-face time counseling regarding the above diagnoses

## 2015-05-05 ENCOUNTER — Other Ambulatory Visit: Payer: Self-pay

## 2015-05-05 ENCOUNTER — Encounter: Payer: Self-pay | Admitting: Sports Medicine

## 2015-05-05 DIAGNOSIS — I10 Essential (primary) hypertension: Secondary | ICD-10-CM

## 2015-05-05 MED ORDER — TELMISARTAN 20 MG PO TABS: 10.0000 mg | ORAL_TABLET | Freq: Every day | ORAL | Status: DC

## 2015-06-11 DIAGNOSIS — L578 Other skin changes due to chronic exposure to nonionizing radiation: Secondary | ICD-10-CM | POA: Diagnosis not present

## 2015-06-11 DIAGNOSIS — L820 Inflamed seborrheic keratosis: Secondary | ICD-10-CM | POA: Diagnosis not present

## 2015-07-13 ENCOUNTER — Other Ambulatory Visit: Payer: Self-pay

## 2015-07-13 DIAGNOSIS — I10 Essential (primary) hypertension: Secondary | ICD-10-CM

## 2015-07-13 MED ORDER — TELMISARTAN 20 MG PO TABS: 10.0000 mg | ORAL_TABLET | Freq: Every day | ORAL | Status: DC

## 2015-08-09 ENCOUNTER — Other Ambulatory Visit: Payer: Self-pay

## 2015-08-09 DIAGNOSIS — Z1231 Encounter for screening mammogram for malignant neoplasm of breast: Secondary | ICD-10-CM

## 2015-09-17 ENCOUNTER — Ambulatory Visit
Admission: RE | Admit: 2015-09-17 | Discharge: 2015-09-17 | Disposition: A | Discharge status: Home / Self Care | Payer: Medicare Other | Source: Ambulatory Visit

## 2015-09-17 DIAGNOSIS — Z1231 Encounter for screening mammogram for malignant neoplasm of breast: Secondary | ICD-10-CM

## 2015-09-20 ENCOUNTER — Other Ambulatory Visit: Payer: Self-pay | Admitting: Sports Medicine

## 2015-09-20 DIAGNOSIS — R928 Other abnormal and inconclusive findings on diagnostic imaging of breast: Secondary | ICD-10-CM

## 2015-09-24 ENCOUNTER — Ambulatory Visit
Admission: RE | Admit: 2015-09-24 | Discharge: 2015-09-24 | Disposition: A | Discharge status: Home / Self Care | Payer: Medicare Other | Source: Ambulatory Visit | Attending: Sports Medicine | Admitting: Sports Medicine

## 2015-09-24 DIAGNOSIS — R928 Other abnormal and inconclusive findings on diagnostic imaging of breast: Secondary | ICD-10-CM | POA: Diagnosis not present

## 2015-09-24 DIAGNOSIS — N6489 Other specified disorders of breast: Secondary | ICD-10-CM | POA: Diagnosis not present

## 2015-11-08 ENCOUNTER — Ambulatory Visit (INDEPENDENT_AMBULATORY_CARE_PROVIDER_SITE_OTHER): Payer: Medicare Other | Admitting: Sports Medicine

## 2015-11-08 ENCOUNTER — Encounter: Payer: Self-pay | Admitting: Sports Medicine

## 2015-11-08 VITALS — BP 144/85 | HR 86 | Resp 16 | Wt 248.5 lb

## 2015-11-08 DIAGNOSIS — Z299 Encounter for prophylactic measures, unspecified: Secondary | ICD-10-CM

## 2015-11-08 DIAGNOSIS — M810 Age-related osteoporosis without current pathological fracture: Secondary | ICD-10-CM

## 2015-11-08 DIAGNOSIS — I10 Essential (primary) hypertension: Secondary | ICD-10-CM | POA: Diagnosis not present

## 2015-11-08 DIAGNOSIS — Z23 Encounter for immunization: Secondary | ICD-10-CM | POA: Diagnosis not present

## 2015-11-08 MED ORDER — OMEPRAZOLE 10 MG PO CPDR: 10.0000 mg | DELAYED_RELEASE_CAPSULE | Freq: Every day | ORAL | Status: DC

## 2015-11-08 MED ORDER — TELMISARTAN 40 MG PO TABS
40.0000 mg | ORAL_TABLET | Freq: Every day | ORAL | Status: DC
Start: 2015-11-08 — End: 2016-09-08

## 2015-11-08 NOTE — Progress Notes (Signed)
  Subjective:    CC:  follow-up  HPI: Hypertension: Has been on Micardis for some time now, agreeable to go up on the dose.  Acid reflux: Needs a refill on omeprazole  Past medical history, Surgical history, Family history not pertinant except as noted below, Social history, Allergies, and medications have been entered into the medical record, reviewed, and no changes needed.   Review of Systems: No fevers, chills, night sweats, weight loss, chest pain, or shortness of breath.   Objective:    General: Well Developed, well nourished, and in no acute distress.  Neuro: Alert and oriented x3, extra-ocular muscles intact, sensation grossly intact.  HEENT: Normocephalic, atraumatic, pupils equal round reactive to light, neck supple, no masses, no lymphadenopathy, thyroid nonpalpable.  Skin: Warm and dry, no rashes. Cardiac: Regular rate and rhythm, no murmurs rubs or gallops, no lower extremity edema.  Respiratory: Clear to auscultation bilaterally. Not using accessory muscles, speaking in full sentences.  Impression and Recommendations:

## 2015-11-08 NOTE — Assessment & Plan Note (Signed)
Ordering DEXA scan, pneumococcal 13 given today.

## 2015-11-08 NOTE — Addendum Note (Signed)
Addended by: Elizabeth Sauer on: 11/08/2015 05:08 PM   Modules accepted: Orders

## 2015-11-08 NOTE — Assessment & Plan Note (Signed)
Blood pressure is elevated, increasing Micardis to 40 mg daily

## 2015-11-17 ENCOUNTER — Ambulatory Visit (INDEPENDENT_AMBULATORY_CARE_PROVIDER_SITE_OTHER): Payer: Medicare Other

## 2015-11-17 DIAGNOSIS — M85852 Other specified disorders of bone density and structure, left thigh: Secondary | ICD-10-CM | POA: Diagnosis not present

## 2015-11-17 DIAGNOSIS — Z780 Asymptomatic menopausal state: Secondary | ICD-10-CM | POA: Diagnosis not present

## 2015-11-24 ENCOUNTER — Telehealth: Payer: Self-pay

## 2015-11-24 NOTE — Telephone Encounter (Signed)
Doesn't have to, I'm just treating highly aggressively, if she wants she can just do calcium and vitamin D supplementation twice a day.

## 2015-11-24 NOTE — Telephone Encounter (Signed)
LPt left a messge asking why she should start Fosamax since her bone density only shows osteopenia. Please advise.

## 2015-11-24 NOTE — Telephone Encounter (Signed)
Pt.notified

## 2015-12-27 DIAGNOSIS — H353132 Nonexudative age-related macular degeneration, bilateral, intermediate dry stage: Secondary | ICD-10-CM | POA: Diagnosis not present

## 2015-12-27 DIAGNOSIS — H01004 Unspecified blepharitis left upper eyelid: Secondary | ICD-10-CM | POA: Diagnosis not present

## 2015-12-27 DIAGNOSIS — H47323 Drusen of optic disc, bilateral: Secondary | ICD-10-CM | POA: Diagnosis not present

## 2015-12-27 DIAGNOSIS — H01001 Unspecified blepharitis right upper eyelid: Secondary | ICD-10-CM | POA: Diagnosis not present

## 2016-01-03 ENCOUNTER — Other Ambulatory Visit: Payer: Self-pay | Admitting: Sports Medicine

## 2016-01-03 DIAGNOSIS — N6310 Unspecified lump in the right breast, unspecified quadrant: Secondary | ICD-10-CM

## 2016-01-07 ENCOUNTER — Ambulatory Visit (INDEPENDENT_AMBULATORY_CARE_PROVIDER_SITE_OTHER): Payer: Medicare Other | Admitting: Sports Medicine

## 2016-01-07 ENCOUNTER — Encounter: Payer: Self-pay | Admitting: Sports Medicine

## 2016-01-07 DIAGNOSIS — G250 Essential tremor: Secondary | ICD-10-CM | POA: Diagnosis not present

## 2016-01-07 NOTE — Progress Notes (Signed)
  Subjective:    CC: Tremor  HPI: For the past several months this pleasant 75 year old female has noted a very mild tremor in both of her hands, predominantly with trying to use them for fine motor activities, it is not intrusive into her life, she has no shaking of her head, no change in her gait. Symptoms are mild, persistent, she does not know of alcohol relaxes or tremor.  Foot pain: Bunion, pain at the first metatarsophalangeal joint. Is not really wearing her orthotics.  Past medical history, Surgical history, Family history not pertinant except as noted below, Social history, Allergies, and medications have been entered into the medical record, reviewed, and no changes needed.   Review of Systems: No fevers, chills, night sweats, weight loss, chest pain, or shortness of breath.   Objective:    General: Well Developed, well nourished, and in no acute distress.  Neuro: Alert and oriented x3, extra-ocular muscles intact, sensation grossly intact. Cranial nerves II through XII are intact, motor, sensory, coordinative functions are all intact, there is a very fine bilateral intention tremor HEENT: Normocephalic, atraumatic, pupils equal round reactive to light, neck supple, no masses, no lymphadenopathy, thyroid nonpalpable.  Skin: Warm and dry, no rashes. Cardiac: Regular rate and rhythm, no murmurs rubs or gallops, no lower extremity edema.  Respiratory: Clear to auscultation bilaterally. Not using accessory muscles, speaking in full sentences. Right Foot: No visible erythema or swelling. Range of motion is full in all directions. Strength is 5/5 in all directions. No hallux valgus. No pes cavus or pes planus. No abnormal callus noted. No pain over the navicular prominence, or base of fifth metatarsal. No tenderness to palpation of the calcaneal insertion of plantar fascia. No pain at the Achilles insertion. No pain over the calcaneal bursa. No pain of the retrocalcaneal  bursa. No tenderness to palpation over the tarsals, metatarsals, or phalanges. No hallux rigidus or limitus. No tenderness palpation over interphalangeal joints. No pain with compression of the metatarsal heads. Neurovascularly intact distally.  Impression and Recommendations:    Benign essential tremor Benign, not intrusive. No need to treat how we could use primidone if needed.   I spent 25 minutes with this patient, greater than 50% was face-to-face time counseling regarding the above diagnoses

## 2016-01-07 NOTE — Assessment & Plan Note (Addendum)
Benign, not intrusive. No need to treat how we could use primidone if needed.

## 2016-02-29 DIAGNOSIS — Z23 Encounter for immunization: Secondary | ICD-10-CM | POA: Diagnosis not present

## 2016-03-20 ENCOUNTER — Ambulatory Visit
Admission: RE | Admit: 2016-03-20 | Discharge: 2016-03-20 | Disposition: A | Discharge status: Home / Self Care | Payer: Medicare Other | Source: Ambulatory Visit | Attending: Sports Medicine | Admitting: Sports Medicine

## 2016-03-20 DIAGNOSIS — N6489 Other specified disorders of breast: Secondary | ICD-10-CM | POA: Diagnosis not present

## 2016-03-20 DIAGNOSIS — N6310 Unspecified lump in the right breast, unspecified quadrant: Secondary | ICD-10-CM

## 2016-03-20 DIAGNOSIS — R928 Other abnormal and inconclusive findings on diagnostic imaging of breast: Secondary | ICD-10-CM | POA: Diagnosis not present

## 2016-08-01 ENCOUNTER — Telehealth: Payer: Self-pay | Admitting: Sports Medicine

## 2016-08-01 NOTE — Telephone Encounter (Signed)
Patient called and request to get a referral to the foot dr. Franco Collet

## 2016-08-01 NOTE — Telephone Encounter (Signed)
Never mind pt stated her insurance company does not require a referral I just gave her the number to podiatry that comes downstairs. Thanks

## 2016-08-07 ENCOUNTER — Ambulatory Visit (INDEPENDENT_AMBULATORY_CARE_PROVIDER_SITE_OTHER): Payer: Medicare Other | Admitting: Podiatry

## 2016-08-07 ENCOUNTER — Encounter: Payer: Self-pay | Admitting: Podiatry

## 2016-08-07 VITALS — Ht 62.5 in | Wt 240.0 lb

## 2016-08-07 DIAGNOSIS — M21969 Unspecified acquired deformity of unspecified lower leg: Secondary | ICD-10-CM | POA: Diagnosis not present

## 2016-08-07 DIAGNOSIS — M722 Plantar fascial fibromatosis: Secondary | ICD-10-CM | POA: Diagnosis not present

## 2016-08-07 DIAGNOSIS — M216X1 Other acquired deformities of right foot: Secondary | ICD-10-CM | POA: Diagnosis not present

## 2016-08-07 DIAGNOSIS — M216X2 Other acquired deformities of left foot: Secondary | ICD-10-CM

## 2016-08-07 NOTE — Patient Instructions (Signed)
Seen for left heel pain.  Noted of weak first metatarsal bone with excess pronating subtalar joint left. Cortisone injection given.  May benefit from a new pair custom orthotics, continue with selective exercise. Return as needed.

## 2016-08-07 NOTE — Progress Notes (Signed)
SUBJECTIVE: 76 y.o. year old female presents complaining of left heel pain, unable to place any weight duration of 6 weeks. Has done icing a few days ago that helped a little. Has been active till having discomfort in lower limbs. Mostly does sits in lounge chair working on computer. Does not stay on feet. Has had custom orthotics made 40 years ago and not in use.  REVIEW OF SYSTEMS: Pertinent items noted in HPI and remainder of comprehensive ROS otherwise negative.  OBJECTIVE: DERMATOLOGIC EXAMINATION: No abnormal findings.  VASCULAR EXAMINATION OF LOWER LIMBS: All pedal pulses are palpable with normal pulsation.  No edema or erythema noted. Temperature gradient from tibial crest to dorsum of foot is within normal bilateral.  NEUROLOGIC EXAMINATION OF THE LOWER LIMBS: Achilles DTR is present and within normal. Monofilament (Semmes-Weinstein 10-gm) sensory testing positive 6 out of 6, bilateral. Vibratory sensations(128Hz  turning fork) intact at medial and lateral forefoot bilateral.  Sharp and Dull discriminatory sensations at the plantar ball of hallux is intact bilateral.   MUSCULOSKELETAL EXAMINATION: Positive for severe hypermobile first ray bilateral. Severe STJ pronation L>R. Flexible flat foot bilateral. Pain under the left heel.  RADIOGRAPHIC STUDIES:  Positive for severe sagittal plane elevation of the first metatarsal bilateral. Severe anterior break in CYMA line on left foot. Increased lateral deviation angle of CCJ on left.  ASSESSMENT: Plantar fasciitis left. Hypermobile first ray bilateral. Hyperpronation of STJ bilateral. Flexible flat foot bilateral. Pain with weight bearing left.  PLAN: Reviewed clinical findings and available treatment options, change in shoe gear, new pair of custom orthotics for heel pain, cortisone injection, NSAIA. As per request left plantar heel injected with mixture of 4 mg Dexamethasone, 4 mg Triamcinolone, and 1 cc of 0.5% Marcaine  plain. Patient tolerated well without difficulty.  Patient will return for further treatment if pain continues.

## 2016-09-08 ENCOUNTER — Encounter: Payer: Self-pay | Admitting: Sports Medicine

## 2016-09-08 ENCOUNTER — Ambulatory Visit (INDEPENDENT_AMBULATORY_CARE_PROVIDER_SITE_OTHER): Payer: Medicare Other | Admitting: Sports Medicine

## 2016-09-08 DIAGNOSIS — I10 Essential (primary) hypertension: Secondary | ICD-10-CM

## 2016-09-08 DIAGNOSIS — E7800 Pure hypercholesterolemia, unspecified: Secondary | ICD-10-CM

## 2016-09-08 DIAGNOSIS — E6609 Other obesity due to excess calories: Secondary | ICD-10-CM

## 2016-09-08 LAB — COMPREHENSIVE METABOLIC PANEL
ALT: 17 U/L (ref 6–29)
AST: 15 U/L (ref 10–35)
Albumin: 3.9 g/dL (ref 3.6–5.1)
Alkaline Phosphatase: 78 U/L (ref 33–130)
BUN: 19 mg/dL (ref 7–25)
Creat: 0.71 mg/dL (ref 0.60–0.93)
Potassium: 4.4 mmol/L (ref 3.5–5.3)
Total Bilirubin: 0.4 mg/dL (ref 0.2–1.2)
Total Protein: 6.4 g/dL (ref 6.1–8.1)

## 2016-09-08 LAB — CBC
HCT: 41.1 % (ref 35.0–45.0)
Hemoglobin: 13.7 g/dL (ref 11.7–15.5)
MCH: 30 pg (ref 27.0–33.0)
MCHC: 33.3 g/dL (ref 32.0–36.0)
MCV: 90.1 fL (ref 80.0–100.0)
MPV: 10 fL (ref 7.5–12.5)
Platelets: 298 10*3/uL (ref 140–400)
RBC: 4.56 MIL/uL (ref 3.80–5.10)
RDW: 13.6 % (ref 11.0–15.0)
WBC: 7.3 10*3/uL (ref 3.8–10.8)

## 2016-09-08 LAB — COMPREHENSIVE METABOLIC PANEL WITH GFR
CO2: 26 mmol/L (ref 20–31)
Calcium: 9.1 mg/dL (ref 8.6–10.4)
Chloride: 105 mmol/L (ref 98–110)
Glucose, Bld: 92 mg/dL (ref 65–99)
Sodium: 142 mmol/L (ref 135–146)

## 2016-09-08 LAB — TSH: TSH: 1.21 m[IU]/L

## 2016-09-08 MED ORDER — OMEPRAZOLE 10 MG PO CPDR: 10.0000 mg | DELAYED_RELEASE_CAPSULE | Freq: Every day | ORAL | 3 refills | Status: DC

## 2016-09-08 MED ORDER — TELMISARTAN 40 MG PO TABS: 40.0000 mg | ORAL_TABLET | Freq: Every day | ORAL | 3 refills | Status: DC

## 2016-09-08 NOTE — Assessment & Plan Note (Signed)
Patient will look into gastric sleeve

## 2016-09-08 NOTE — Progress Notes (Signed)
  Subjective:    CC: Follow-up  HPI: Madison Hardin returns, she would like some refills on her medications as well as routine blood work, otherwise has no complaints.  Past medical history:  Negative.  See flowsheet/record as well for more information.  Surgical history: Negative.  See flowsheet/record as well for more information.  Family history: Negative.  See flowsheet/record as well for more information.  Social history: Negative.  See flowsheet/record as well for more information.  Allergies, and medications have been entered into the medical record, reviewed, and no changes needed.   Review of Systems: No fevers, chills, night sweats, weight loss, chest pain, or shortness of breath.   Objective:    General: Well Developed, well nourished, and in no acute distress.  Neuro: Alert and oriented x3, extra-ocular muscles intact, sensation grossly intact.  HEENT: Normocephalic, atraumatic, pupils equal round reactive to light, neck supple, no masses, no lymphadenopathy, thyroid nonpalpable.  Skin: Warm and dry, no rashes. Cardiac: Regular rate and rhythm, no murmurs rubs or gallops, no lower extremity edema.  Respiratory: Clear to auscultation bilaterally. Not using accessory muscles, speaking in full sentences.  Impression and Recommendations:    Hypertension Overall stable, work on weight loss before considering changes in medication dosage.   Hyperlipidemia Routine blood work. She does desire full advanced lipid profile.   Obesity Patient will look into gastric sleeve  I spent 25 minutes with this patient, greater than 50% was face-to-face time counseling regarding the above diagnoses

## 2016-09-08 NOTE — Assessment & Plan Note (Signed)
Overall stable, work on weight loss before considering changes in medication dosage.

## 2016-09-08 NOTE — Assessment & Plan Note (Signed)
Routine blood work. She does desire full advanced lipid profile.

## 2016-09-09 LAB — HEMOGLOBIN A1C
Hgb A1c MFr Bld: 5.3 % (ref <5.7)
Mean Plasma Glucose: 105 mg/dL

## 2016-09-09 LAB — VITAMIN D 25 HYDROXY (VIT D DEFICIENCY, FRACTURES): Vit D, 25-Hydroxy: 36 ng/mL (ref 30–100)

## 2016-09-12 LAB — CARDIO IQ(R) ADVANCED LIPID PANEL
Apolipoprotein B: 133 mg/dL — ABNORMAL HIGH (ref 49–103)
Cholesterol, Total: 272 mg/dL — ABNORMAL HIGH (ref <200)
Cholesterol/HDL Ratio: 4.7 calc (ref <5.0)
HDL Cholesterol: 58 mg/dL (ref >50)
LDL Large: 5363 nmol/L (ref 5038–17886)
LDL Medium: 291 nmol/L (ref 121–397)
LDL Particle Number: 1581 nmol/L (ref 1016–2185)
LDL Peak Size: 223.6 Angstrom (ref >=218.2)
LDL Small: 238 nmol/L (ref 115–386)
LDL, Calculated: 194 mg/dL — ABNORMAL HIGH (ref <100)
Lipoprotein (a): >600 nmol/L — ABNORMAL HIGH (ref <75)
Non-HDL Cholesterol: 214 mg/dL (calc) — ABNORMAL HIGH (ref <130)
Triglycerides: 90 mg/dL (ref <150)

## 2016-12-27 DIAGNOSIS — H353132 Nonexudative age-related macular degeneration, bilateral, intermediate dry stage: Secondary | ICD-10-CM | POA: Diagnosis not present

## 2016-12-27 DIAGNOSIS — H47323 Drusen of optic disc, bilateral: Secondary | ICD-10-CM | POA: Diagnosis not present

## 2016-12-27 DIAGNOSIS — H25813 Combined forms of age-related cataract, bilateral: Secondary | ICD-10-CM | POA: Diagnosis not present

## 2016-12-27 DIAGNOSIS — H01004 Unspecified blepharitis left upper eyelid: Secondary | ICD-10-CM | POA: Diagnosis not present

## 2017-01-16 ENCOUNTER — Other Ambulatory Visit: Payer: Self-pay | Admitting: Sports Medicine

## 2017-01-16 DIAGNOSIS — N6489 Other specified disorders of breast: Secondary | ICD-10-CM

## 2017-01-22 ENCOUNTER — Ambulatory Visit
Admission: RE | Admit: 2017-01-22 | Discharge: 2017-01-22 | Disposition: A | Discharge status: Home / Self Care | Payer: Medicare Other | Source: Ambulatory Visit | Attending: Sports Medicine | Admitting: Sports Medicine

## 2017-01-22 DIAGNOSIS — R928 Other abnormal and inconclusive findings on diagnostic imaging of breast: Secondary | ICD-10-CM | POA: Diagnosis not present

## 2017-01-22 DIAGNOSIS — N6489 Other specified disorders of breast: Secondary | ICD-10-CM

## 2017-03-01 ENCOUNTER — Ambulatory Visit (INDEPENDENT_AMBULATORY_CARE_PROVIDER_SITE_OTHER): Payer: Medicare Other | Admitting: Sports Medicine

## 2017-03-01 DIAGNOSIS — L989 Disorder of the skin and subcutaneous tissue, unspecified: Secondary | ICD-10-CM | POA: Diagnosis not present

## 2017-03-01 DIAGNOSIS — H6123 Impacted cerumen, bilateral: Secondary | ICD-10-CM | POA: Diagnosis not present

## 2017-03-01 DIAGNOSIS — L281 Prurigo nodularis: Secondary | ICD-10-CM | POA: Insufficient documentation

## 2017-03-01 NOTE — Progress Notes (Signed)
  Subjective:    CC: slow healing wound   HPI: The patient is here today for a slow healing lesion on her left lower leg. She states that the lesion has been there for decades and was previously stable. She reports that five weeks ago she scratched the area. Two weeks ago she noticed the lesion had grown in size to about the size of a nickle and has become white and scaly. No history of psoriasis or eczema.   She also asked to have her ears cleaned today as she believes she has a lot of ear wax in both ears.   Past medical history:  Negative.  See flowsheet/record as well for more information.  Surgical history: Negative.  See flowsheet/record as well for more information.  Family history: Negative.  See flowsheet/record as well for more information.  Social history: Negative.  See flowsheet/record as well for more information.  Allergies, and medications have been entered into the medical record, reviewed, and no changes needed.   Review of Systems: No fevers, chills, night sweats, weight loss, chest pain, or shortness of breath.   Objective:    General: Well Developed, well nourished, and in no acute distress.  Neuro: Alert and oriented x3, extra-ocular muscles intact, sensation grossly intact.  HEENT: Normocephalic, atraumatic, pupils equal round reactive to light, neck supple, no masses, no lymphadenopathy, thyroid nonpalpable. Bilateral cerumen impaction.  Skin: Warm and dry. 2cm lesion on the lateral aspect of the left lower leg. Cardiac: Regular rate and rhythm, no murmurs rubs or gallops, no lower extremity edema.  Respiratory: Clear to auscultation bilaterally. Not using accessory muscles, speaking in full sentences.   Impression and Recommendations:    Skin lesion on leg. Needs excisional biopsy. She will return Monday 10/1 for excisional biopsy.  Cerumen impaction. Needs irrigation.   No problem-specific Assessment & Plan notes found for this  encounter.   ___________________________________________ Gwen Her. Dianah Field, M.D., ABFM., CAQSM. Primary Care and [place] Instructor of [place] of [facility]

## 2017-03-01 NOTE — Assessment & Plan Note (Signed)
Unsure what this is but it's been present for many years,friable, growing slowly, question squamous cell carcinoma. Excisional biopsy on Monday.

## 2017-03-05 ENCOUNTER — Ambulatory Visit (INDEPENDENT_AMBULATORY_CARE_PROVIDER_SITE_OTHER): Payer: Medicare Other | Admitting: Sports Medicine

## 2017-03-05 DIAGNOSIS — L989 Disorder of the skin and subcutaneous tissue, unspecified: Secondary | ICD-10-CM

## 2017-03-05 DIAGNOSIS — Z23 Encounter for immunization: Secondary | ICD-10-CM | POA: Diagnosis not present

## 2017-03-05 DIAGNOSIS — L281 Prurigo nodularis: Secondary | ICD-10-CM

## 2017-03-05 MED ORDER — HYDROCODONE-ACETAMINOPHEN 5-325 MG PO TABS: 1.0000 | ORAL_TABLET | Freq: Three times a day (TID) | ORAL | 0 refills | Status: DC | PRN

## 2017-03-05 NOTE — Progress Notes (Signed)
   Procedure:  Excision of  left leg skin lesion, 3.5 cm Risks, benefits, and alternatives explained and consent obtained. Time out conducted. Surface prepped with alcohol. 10cc lidocaine with epinephine infiltrated in a field block. Adequate anesthesia ensured. Area prepped and draped in a sterile fashion. Excision performed with: I made an elliptical incision, leaving a 0.5 cm margin, been using both sharp and blunt dissection was able to remove the entire lesion down to the subcutaneous tissues in block, I closed the incision with a number of 0 Prolene as well as 3-0 Ethilon sutures due to the tension across the wound. Hemostasis achieved. Pt stable.

## 2017-03-05 NOTE — Assessment & Plan Note (Addendum)
Excisional biopsy with primary closure. Return in 10 days for a wound check.

## 2017-03-05 NOTE — Addendum Note (Signed)
Addended by: Elizabeth Sauer on: 03/05/2017 11:49 AM   Modules accepted: Orders

## 2017-03-12 ENCOUNTER — Ambulatory Visit: Payer: Medicare Other | Admitting: Sports Medicine

## 2017-03-12 ENCOUNTER — Encounter: Payer: Self-pay | Admitting: Sports Medicine

## 2017-03-12 DIAGNOSIS — E6609 Other obesity due to excess calories: Secondary | ICD-10-CM

## 2017-03-12 DIAGNOSIS — L281 Prurigo nodularis: Secondary | ICD-10-CM

## 2017-03-12 MED ORDER — PHENTERMINE HCL 37.5 MG PO TABS: ORAL_TABLET | ORAL | 0 refills | Status: DC

## 2017-03-12 NOTE — Assessment & Plan Note (Signed)
Doing well 1 week post excision, I think we'll need to wait an additional week before removing the sutures considering the tension on the wound when I closed it. She will also hold off on starting phentermine for a week.

## 2017-03-12 NOTE — Progress Notes (Signed)
  Subjective: 1 week post excision  Objective: General: Well-developed, well-nourished, and in no acute distress. Left leg: Incision is clean, dry, intact. I don't feel as though it's appropriate to remove the sutures just yet.  Assessment/plan:   Obesity Restarting phentermine, she will start it next week considering healing incision. She can thus return for a weight check in 5 weeks from now.  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 She will also hold off on starting phentermine for a week.  ___________________________________________ Gwen Her. Dianah Field, M.D., ABFM., CAQSM. Primary Care and [place] Instructor of [place] of [facility]

## 2017-03-12 NOTE — Assessment & Plan Note (Signed)
Restarting phentermine, she will start it next week considering healing incision. She can thus return for a weight check in 5 weeks from now.

## 2017-03-16 ENCOUNTER — Encounter: Payer: Self-pay | Admitting: Sports Medicine

## 2017-03-16 ENCOUNTER — Ambulatory Visit: Payer: Medicare Other | Admitting: Sports Medicine

## 2017-03-16 DIAGNOSIS — L281 Prurigo nodularis: Secondary | ICD-10-CM

## 2017-03-16 NOTE — Progress Notes (Signed)
  Subjective: Approximately 14 days post-surgical excision of a skin lesion on the left lower leg, doing well.   Objective: General: Well-developed, well-nourished, and in no acute distress. Incision: Clean, dry, intact, sutures removed, I did place Steri-Strips with benzoin to reinforce the wound.   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 Avoid swimming for the next half a week or so, and we will keep an eye on the incision.  ___________________________________________ Gwen Her. Dianah Field, M.D., ABFM., CAQSM. Primary Care and [place] Instructor of [place] of [facility]

## 2017-03-16 NOTE — Assessment & Plan Note (Signed)
Incision is clean, dry, intact, Steri-Strips applied with benzoin. Avoid swimming for the next half a week or so, and we will keep an eye on the incision.

## 2017-05-23 ENCOUNTER — Ambulatory Visit: Payer: Self-pay | Admitting: Sports Medicine

## 2017-08-29 ENCOUNTER — Other Ambulatory Visit: Payer: Self-pay

## 2017-08-29 DIAGNOSIS — I10 Essential (primary) hypertension: Secondary | ICD-10-CM

## 2017-08-29 MED ORDER — OMEPRAZOLE 10 MG PO CPDR: 10.0000 mg | DELAYED_RELEASE_CAPSULE | Freq: Every day | ORAL | 3 refills | Status: AC

## 2017-08-29 MED ORDER — TELMISARTAN 40 MG PO TABS: 40.0000 mg | ORAL_TABLET | Freq: Every day | ORAL | 3 refills | Status: DC

## 2017-09-03 ENCOUNTER — Ambulatory Visit: Payer: Medicare Other | Admitting: Sports Medicine

## 2017-09-05 ENCOUNTER — Ambulatory Visit: Payer: Medicare Other | Admitting: Sports Medicine

## 2017-09-06 ENCOUNTER — Ambulatory Visit: Payer: Medicare Other | Admitting: Sports Medicine

## 2017-10-25 ENCOUNTER — Encounter: Payer: Self-pay | Admitting: *Deleted

## 2017-11-21 ENCOUNTER — Encounter: Payer: Self-pay | Admitting: Internal Medicine

## 2017-12-17 ENCOUNTER — Encounter: Payer: Self-pay | Admitting: Podiatry

## 2017-12-17 ENCOUNTER — Ambulatory Visit (INDEPENDENT_AMBULATORY_CARE_PROVIDER_SITE_OTHER): Payer: Medicare Other | Admitting: Podiatry

## 2017-12-17 DIAGNOSIS — M216X2 Other acquired deformities of left foot: Secondary | ICD-10-CM | POA: Diagnosis not present

## 2017-12-17 DIAGNOSIS — M792 Neuralgia and neuritis, unspecified: Secondary | ICD-10-CM

## 2017-12-17 DIAGNOSIS — M659 Synovitis and tenosynovitis, unspecified: Secondary | ICD-10-CM

## 2017-12-17 DIAGNOSIS — M216X1 Other acquired deformities of right foot: Secondary | ICD-10-CM

## 2017-12-17 NOTE — Progress Notes (Signed)
Last heel pain was resolved with injection on 08/07/16. Last 4-5 month the left foot clicks when walk with occasional sharp pain, also dissipated immediately at the lateral planta anterior aspect left ankle. This pain is random during the day. Clicking happens in the morning when getting out of bed.  Subjective: 77 y.o. year old female patient presents complaining of pain pain in left ankle.  Objective: Dermatologic: Normal findings. Vascular: Pedal pulses are all palpable. Orthopedic: Severe hypermobile first ray bilateral. Severe STJ pronation bilateral. Flexible flat foot bilateral. Pain in lateral ankle. Neurologic: All epicritic and tactile sensations grossly intact.  Assessment: Neuralgia left lateral cutaneous nerve. STJ hyperpronation. Unstable first ray with flexible flat foot. Tenosynovitis left lateral foot.  Treatment: Reviewed findings and available treatment options, change in shoe gear, injection, NSAIA, change activities. Two different kinds of ankle brace tried. Patient was not able to handle neither one. Patient will seek a shoe maker to get herself a high top shoe or boot. Return as needed.

## 2017-12-17 NOTE — Patient Instructions (Signed)
Seen for left lateral ankle pain. Abnormal biomechanics of left foot. Need to add more stability to left ankle.  Will try high top shoes or boots. Return as needed.

## 2018-01-10 ENCOUNTER — Ambulatory Visit (AMBULATORY_SURGERY_CENTER): Payer: Self-pay

## 2018-01-10 VITALS — Ht 62.5 in | Wt 228.6 lb

## 2018-01-10 DIAGNOSIS — Z86010 Personal history of colonic polyps: Secondary | ICD-10-CM

## 2018-01-10 MED ORDER — PEG 3350-KCL-NA BICARB-NACL 420 G PO SOLR: 4000.0000 mL | Freq: Once | ORAL | 0 refills | Status: AC

## 2018-01-10 NOTE — Progress Notes (Signed)
No egg or soy allergy known to patient  No issues with past sedation with any surgeries  or procedures, no intubation problems  No diet pills per patient No home 02 use per patient  No blood thinners per patient  Pt denies issues with constipation  No A fib or A flutter  EMMI video sent to pt's e mail  Pt. declined 

## 2018-01-14 DIAGNOSIS — H0100B Unspecified blepharitis left eye, upper and lower eyelids: Secondary | ICD-10-CM | POA: Diagnosis not present

## 2018-01-14 DIAGNOSIS — H353132 Nonexudative age-related macular degeneration, bilateral, intermediate dry stage: Secondary | ICD-10-CM | POA: Diagnosis not present

## 2018-01-14 DIAGNOSIS — H25813 Combined forms of age-related cataract, bilateral: Secondary | ICD-10-CM | POA: Diagnosis not present

## 2018-01-14 DIAGNOSIS — H527 Unspecified disorder of refraction: Secondary | ICD-10-CM | POA: Diagnosis not present

## 2018-01-14 DIAGNOSIS — H47323 Drusen of optic disc, bilateral: Secondary | ICD-10-CM | POA: Diagnosis not present

## 2018-01-14 DIAGNOSIS — H0100A Unspecified blepharitis right eye, upper and lower eyelids: Secondary | ICD-10-CM | POA: Diagnosis not present

## 2018-01-14 LAB — HM DIABETES EYE EXAM

## 2018-01-23 ENCOUNTER — Encounter: Payer: Self-pay | Admitting: Internal Medicine

## 2018-01-23 ENCOUNTER — Ambulatory Visit (AMBULATORY_SURGERY_CENTER): Payer: Medicare Other | Admitting: Internal Medicine

## 2018-01-23 VITALS — BP 126/60 | HR 66 | Temp 98.0°F | Resp 13 | Ht 62.0 in | Wt 245.0 lb

## 2018-01-23 DIAGNOSIS — Z86010 Personal history of colonic polyps: Secondary | ICD-10-CM | POA: Diagnosis not present

## 2018-01-23 DIAGNOSIS — R011 Cardiac murmur, unspecified: Secondary | ICD-10-CM | POA: Diagnosis not present

## 2018-01-23 DIAGNOSIS — D123 Benign neoplasm of transverse colon: Secondary | ICD-10-CM

## 2018-01-23 DIAGNOSIS — Z1211 Encounter for screening for malignant neoplasm of colon: Secondary | ICD-10-CM | POA: Diagnosis not present

## 2018-01-23 DIAGNOSIS — K219 Gastro-esophageal reflux disease without esophagitis: Secondary | ICD-10-CM | POA: Diagnosis not present

## 2018-01-23 DIAGNOSIS — I10 Essential (primary) hypertension: Secondary | ICD-10-CM | POA: Diagnosis not present

## 2018-01-23 MED ORDER — SODIUM CHLORIDE 0.9 % IV SOLN: 500.0000 mL | Freq: Once | INTRAVENOUS | Status: DC

## 2018-01-23 NOTE — Patient Instructions (Signed)
   INFORMATION ON POLYPS GIVEN TO YOU TODAY   AWAIT PATHOLOGY RESULTS ON POLYPS REMOVED    YOU HAD AN ENDOSCOPIC PROCEDURE TODAY AT THE [facility]:   Refer to the procedure report that was given to you for any specific questions about what was found during the examination.  If the procedure report does not answer your questions, please call your gastroenterologist to clarify.  If you requested that your care partner not be given the details of your procedure findings, then the procedure report has been included in a sealed envelope for you to review at your convenience later.  YOU SHOULD EXPECT: Some feelings of bloating in the abdomen. Passage of more gas than usual.  Walking can help get rid of the air that was put into your GI tract during the procedure and reduce the bloating. If you had a lower endoscopy (such as a colonoscopy or flexible sigmoidoscopy) you may notice spotting of blood in your stool or on the toilet paper. If you underwent a bowel prep for your procedure, you may not have a normal bowel movement for a few days.  Please Note:  You might notice some irritation and congestion in your nose or some drainage.  This is from the oxygen used during your procedure.  There is no need for concern and it should clear up in a day or so.  SYMPTOMS TO REPORT IMMEDIATELY:   Following lower endoscopy (colonoscopy or flexible sigmoidoscopy):  Excessive amounts of blood in the stool  Significant tenderness or worsening of abdominal pains  Swelling of the abdomen that is new, acute  Fever of 100F or higher    For urgent or emergent issues, a gastroenterologist can be reached at any hour by calling [phone].   DIET:  We do recommend a small meal at first, but then you may proceed to your regular diet.  Drink plenty of fluids but you should avoid alcoholic beverages for 24 hours.  ACTIVITY:  You should plan to take it easy for the rest of today and you should NOT  DRIVE or use heavy machinery until tomorrow (because of the sedation medicines used during the test).    FOLLOW UP: Our staff will call the number listed on your records the next business day following your procedure to check on you and address any questions or concerns that you may have regarding the information given to you following your procedure. If we do not reach you, we will leave a message.  However, if you are feeling well and you are not experiencing any problems, there is no need to return our call.  We will assume that you have returned to your regular daily activities without incident.  If any biopsies were taken you will be contacted by phone or by letter within the next 1-3 weeks.  Please call us at [phone] if you have not heard about the biopsies in 3 weeks.    SIGNATURES/CONFIDENTIALITY: You and/or your care partner have signed paperwork which will be entered into your electronic medical record.  These signatures attest to the fact that that the information above on your After Visit Summary has been reviewed and is understood.  Full responsibility of the confidentiality of this discharge information lies with you and/or your care-partner. 

## 2018-01-23 NOTE — Progress Notes (Signed)
To PACU, vss patent aw report to rn 

## 2018-01-23 NOTE — Progress Notes (Signed)
Called to room to assist during endoscopic procedure.  Patient ID and intended procedure confirmed with present staff. Received instructions for my participation in the procedure from the performing physician.  

## 2018-01-23 NOTE — Op Note (Signed)
[place] [place] Patient Name: Glen Blatchley Procedure Date: 01/23/2018 2:47 PM MRN: [id number] Endoscopist: Jerene Bears , MD Age: 77 Referring MD:  Date of Birth: Sep 23, 1940 Gender: Female Account #: [id number] Procedure:                Colonoscopy Indications:              Surveillance: Personal history of adenomatous                            polyps on last colonoscopy 5 years ago Medicines:                Monitored Anesthesia Care Procedure:                Pre-Anesthesia Assessment:                           - Prior to the procedure, a History and Physical                            was performed, and patient medications and                            allergies were reviewed. The patient's tolerance of                            previous anesthesia was also reviewed. The risks                            and benefits of the procedure and the sedation                            options and risks were discussed with the patient.                            All questions were answered, and informed consent                            was obtained. Prior Anticoagulants: The patient has                            taken no previous anticoagulant or antiplatelet                            agents. ASA Grade Assessment: III - A patient with                            severe systemic disease. After reviewing the risks                            and benefits, the patient was deemed in                            satisfactory condition to undergo the procedure.  After obtaining informed consent, the colonoscope                            was passed under direct vision. Throughout the                            procedure, the patient's blood pressure, pulse, and                            oxygen saturations were monitored continuously. The                            Colonoscope was introduced through the anus and                            advanced to the cecum,  identified by appendiceal                            orifice and ileocecal valve. The colonoscopy was                            performed with difficulty due to significant                            looping and a tortuous colon. Successful completion                            of the procedure was aided by applying abdominal                            pressure. The patient tolerated the procedure well. Scope In: 2:51:22 PM Scope Out: 3:24:53 PM Scope Withdrawal Time: 0 hours 20 minutes 37 seconds  Total Procedure Duration: 0 hours 33 minutes 31 seconds  Findings:                 The digital rectal exam was normal.                           Three sessile polyps were found in the transverse                            colon. The polyps were 4 to 6 mm in size. These                            polyps were removed with a cold snare. Resection                            and retrieval were complete.                           Anal papilla(e) were hypertrophied on retroflexion                            and retroflexion otherwise normal. Complications:  No immediate complications. Estimated Blood Loss:     Estimated blood loss was minimal. Impression:               - Three 4 to 6 mm polyps in the transverse colon,                            removed with a cold snare. Resected and retrieved.                           - Anal papillae were hypertrophied. Recommendation:           - Patient has a contact number available for                            emergencies. The signs and symptoms of potential                            delayed complications were discussed with the                            patient. Return to normal activities tomorrow.                            Written discharge instructions were provided to the                            patient.                           - Resume previous diet.                           - Continue present medications.                            - Await pathology results.                           - No recommendation at this time regarding repeat                            colonoscopy due to age at next surveillance                            interval. Jerene Bears, MD 01/23/2018 3:29:11 PM This report has been signed electronically.

## 2018-01-24 ENCOUNTER — Telehealth: Payer: Self-pay | Admitting: *Deleted

## 2018-01-24 NOTE — Telephone Encounter (Signed)
  Follow up Call-  Call back number 01/23/2018  Post procedure Call Back phone  # [phone]  Permission to leave phone message Yes  Some recent data might be hidden     Patient questions:  Do you have a fever, pain , or abdominal swelling? No. Pain Score  0 *  Have you tolerated food without any problems? Yes.    Have you been able to return to your normal activities? Yes.    Do you have any questions about your discharge instructions: Diet   No. Medications  No. Follow up visit  No.  Do you have questions or concerns about your Care? No.  Actions: * If pain score is 4 or above: No action needed, pain <4.

## 2018-01-29 ENCOUNTER — Encounter: Payer: Self-pay | Admitting: Internal Medicine

## 2018-02-08 ENCOUNTER — Encounter: Payer: Self-pay | Admitting: Sports Medicine

## 2018-02-20 DIAGNOSIS — H47323 Drusen of optic disc, bilateral: Secondary | ICD-10-CM | POA: Diagnosis not present

## 2018-02-20 DIAGNOSIS — H527 Unspecified disorder of refraction: Secondary | ICD-10-CM | POA: Diagnosis not present

## 2018-02-20 DIAGNOSIS — H43812 Vitreous degeneration, left eye: Secondary | ICD-10-CM | POA: Diagnosis not present

## 2018-02-20 DIAGNOSIS — H0100B Unspecified blepharitis left eye, upper and lower eyelids: Secondary | ICD-10-CM | POA: Diagnosis not present

## 2018-02-20 DIAGNOSIS — H353132 Nonexudative age-related macular degeneration, bilateral, intermediate dry stage: Secondary | ICD-10-CM | POA: Diagnosis not present

## 2018-02-20 DIAGNOSIS — H52223 Regular astigmatism, bilateral: Secondary | ICD-10-CM | POA: Diagnosis not present

## 2018-02-20 DIAGNOSIS — H25813 Combined forms of age-related cataract, bilateral: Secondary | ICD-10-CM | POA: Diagnosis not present

## 2018-02-20 DIAGNOSIS — H0100A Unspecified blepharitis right eye, upper and lower eyelids: Secondary | ICD-10-CM | POA: Diagnosis not present

## 2018-02-20 LAB — HM DIABETES EYE EXAM

## 2018-02-25 ENCOUNTER — Other Ambulatory Visit: Payer: Self-pay | Admitting: Sports Medicine

## 2018-02-25 DIAGNOSIS — Z23 Encounter for immunization: Secondary | ICD-10-CM | POA: Diagnosis not present

## 2018-02-25 DIAGNOSIS — Z1231 Encounter for screening mammogram for malignant neoplasm of breast: Secondary | ICD-10-CM

## 2018-03-05 DIAGNOSIS — H269 Unspecified cataract: Secondary | ICD-10-CM

## 2018-03-05 HISTORY — DX: Unspecified cataract: H26.9

## 2018-03-08 ENCOUNTER — Encounter: Payer: Self-pay | Admitting: Sports Medicine

## 2018-03-12 DIAGNOSIS — Z87891 Personal history of nicotine dependence: Secondary | ICD-10-CM | POA: Diagnosis not present

## 2018-03-12 DIAGNOSIS — H0100B Unspecified blepharitis left eye, upper and lower eyelids: Secondary | ICD-10-CM | POA: Diagnosis not present

## 2018-03-12 DIAGNOSIS — K219 Gastro-esophageal reflux disease without esophagitis: Secondary | ICD-10-CM | POA: Diagnosis not present

## 2018-03-12 DIAGNOSIS — H353132 Nonexudative age-related macular degeneration, bilateral, intermediate dry stage: Secondary | ICD-10-CM | POA: Diagnosis not present

## 2018-03-12 DIAGNOSIS — H43812 Vitreous degeneration, left eye: Secondary | ICD-10-CM | POA: Diagnosis not present

## 2018-03-12 DIAGNOSIS — I10 Essential (primary) hypertension: Secondary | ICD-10-CM | POA: Diagnosis not present

## 2018-03-12 DIAGNOSIS — H0100A Unspecified blepharitis right eye, upper and lower eyelids: Secondary | ICD-10-CM | POA: Diagnosis not present

## 2018-03-12 DIAGNOSIS — Z79899 Other long term (current) drug therapy: Secondary | ICD-10-CM | POA: Diagnosis not present

## 2018-03-12 DIAGNOSIS — H25811 Combined forms of age-related cataract, right eye: Secondary | ICD-10-CM | POA: Diagnosis not present

## 2018-03-12 DIAGNOSIS — H52223 Regular astigmatism, bilateral: Secondary | ICD-10-CM | POA: Diagnosis not present

## 2018-03-12 DIAGNOSIS — H25813 Combined forms of age-related cataract, bilateral: Secondary | ICD-10-CM | POA: Diagnosis not present

## 2018-03-13 DIAGNOSIS — H52222 Regular astigmatism, left eye: Secondary | ICD-10-CM | POA: Diagnosis not present

## 2018-03-13 DIAGNOSIS — H353132 Nonexudative age-related macular degeneration, bilateral, intermediate dry stage: Secondary | ICD-10-CM | POA: Diagnosis not present

## 2018-03-13 DIAGNOSIS — H47323 Drusen of optic disc, bilateral: Secondary | ICD-10-CM | POA: Diagnosis not present

## 2018-03-13 DIAGNOSIS — H25812 Combined forms of age-related cataract, left eye: Secondary | ICD-10-CM | POA: Diagnosis not present

## 2018-03-13 DIAGNOSIS — Z961 Presence of intraocular lens: Secondary | ICD-10-CM | POA: Diagnosis not present

## 2018-03-13 DIAGNOSIS — H43812 Vitreous degeneration, left eye: Secondary | ICD-10-CM | POA: Diagnosis not present

## 2018-03-13 DIAGNOSIS — H0100B Unspecified blepharitis left eye, upper and lower eyelids: Secondary | ICD-10-CM | POA: Diagnosis not present

## 2018-03-13 DIAGNOSIS — H527 Unspecified disorder of refraction: Secondary | ICD-10-CM | POA: Diagnosis not present

## 2018-03-13 DIAGNOSIS — H0100A Unspecified blepharitis right eye, upper and lower eyelids: Secondary | ICD-10-CM | POA: Diagnosis not present

## 2018-03-28 DIAGNOSIS — H25812 Combined forms of age-related cataract, left eye: Secondary | ICD-10-CM | POA: Diagnosis not present

## 2018-03-28 DIAGNOSIS — H527 Unspecified disorder of refraction: Secondary | ICD-10-CM | POA: Diagnosis not present

## 2018-03-28 DIAGNOSIS — I10 Essential (primary) hypertension: Secondary | ICD-10-CM | POA: Diagnosis not present

## 2018-03-28 DIAGNOSIS — H0100A Unspecified blepharitis right eye, upper and lower eyelids: Secondary | ICD-10-CM | POA: Diagnosis not present

## 2018-03-28 DIAGNOSIS — Z87891 Personal history of nicotine dependence: Secondary | ICD-10-CM | POA: Diagnosis not present

## 2018-03-28 DIAGNOSIS — K219 Gastro-esophageal reflux disease without esophagitis: Secondary | ICD-10-CM | POA: Diagnosis not present

## 2018-03-28 DIAGNOSIS — H0100B Unspecified blepharitis left eye, upper and lower eyelids: Secondary | ICD-10-CM | POA: Diagnosis not present

## 2018-03-28 DIAGNOSIS — H353130 Nonexudative age-related macular degeneration, bilateral, stage unspecified: Secondary | ICD-10-CM | POA: Diagnosis not present

## 2018-03-29 DIAGNOSIS — H43812 Vitreous degeneration, left eye: Secondary | ICD-10-CM | POA: Diagnosis not present

## 2018-03-29 DIAGNOSIS — H0100B Unspecified blepharitis left eye, upper and lower eyelids: Secondary | ICD-10-CM | POA: Diagnosis not present

## 2018-03-29 DIAGNOSIS — H47323 Drusen of optic disc, bilateral: Secondary | ICD-10-CM | POA: Diagnosis not present

## 2018-03-29 DIAGNOSIS — Z961 Presence of intraocular lens: Secondary | ICD-10-CM | POA: Diagnosis not present

## 2018-03-29 DIAGNOSIS — H527 Unspecified disorder of refraction: Secondary | ICD-10-CM | POA: Diagnosis not present

## 2018-03-29 DIAGNOSIS — H0100A Unspecified blepharitis right eye, upper and lower eyelids: Secondary | ICD-10-CM | POA: Diagnosis not present

## 2018-03-29 DIAGNOSIS — H353132 Nonexudative age-related macular degeneration, bilateral, intermediate dry stage: Secondary | ICD-10-CM | POA: Diagnosis not present

## 2018-04-10 ENCOUNTER — Ambulatory Visit
Admission: RE | Admit: 2018-04-10 | Discharge: 2018-04-10 | Disposition: A | Discharge status: Home / Self Care | Payer: Medicare Other | Source: Ambulatory Visit | Attending: Sports Medicine | Admitting: Sports Medicine

## 2018-04-10 DIAGNOSIS — Z1231 Encounter for screening mammogram for malignant neoplasm of breast: Secondary | ICD-10-CM | POA: Diagnosis not present

## 2018-04-11 ENCOUNTER — Other Ambulatory Visit: Payer: Self-pay | Admitting: Sports Medicine

## 2018-04-11 ENCOUNTER — Encounter: Payer: Self-pay | Admitting: Sports Medicine

## 2018-04-11 DIAGNOSIS — R921 Mammographic calcification found on diagnostic imaging of breast: Secondary | ICD-10-CM

## 2018-04-19 ENCOUNTER — Other Ambulatory Visit: Payer: Self-pay

## 2018-04-19 DIAGNOSIS — Z961 Presence of intraocular lens: Secondary | ICD-10-CM | POA: Diagnosis not present

## 2018-04-23 ENCOUNTER — Ambulatory Visit
Admission: RE | Admit: 2018-04-23 | Discharge: 2018-04-23 | Disposition: A | Discharge status: Home / Self Care | Payer: Medicare Other | Source: Ambulatory Visit | Attending: Sports Medicine | Admitting: Sports Medicine

## 2018-04-23 ENCOUNTER — Other Ambulatory Visit: Payer: Self-pay | Admitting: Sports Medicine

## 2018-04-23 DIAGNOSIS — R921 Mammographic calcification found on diagnostic imaging of breast: Secondary | ICD-10-CM

## 2018-04-25 ENCOUNTER — Ambulatory Visit
Admission: RE | Admit: 2018-04-25 | Discharge: 2018-04-25 | Disposition: A | Discharge status: Home / Self Care | Payer: Medicare Other | Source: Ambulatory Visit | Attending: Sports Medicine | Admitting: Sports Medicine

## 2018-04-25 DIAGNOSIS — C50412 Malignant neoplasm of upper-outer quadrant of left female breast: Secondary | ICD-10-CM | POA: Diagnosis not present

## 2018-04-25 DIAGNOSIS — R921 Mammographic calcification found on diagnostic imaging of breast: Secondary | ICD-10-CM

## 2018-04-26 ENCOUNTER — Other Ambulatory Visit: Payer: Self-pay | Admitting: Sports Medicine

## 2018-04-26 DIAGNOSIS — Z853 Personal history of malignant neoplasm of breast: Secondary | ICD-10-CM

## 2018-04-29 ENCOUNTER — Ambulatory Visit
Admission: RE | Admit: 2018-04-29 | Discharge: 2018-04-29 | Disposition: A | Discharge status: Home / Self Care | Payer: Medicare Other | Source: Ambulatory Visit | Attending: Sports Medicine | Admitting: Sports Medicine

## 2018-04-29 DIAGNOSIS — C50912 Malignant neoplasm of unspecified site of left female breast: Secondary | ICD-10-CM | POA: Diagnosis not present

## 2018-04-29 DIAGNOSIS — Z853 Personal history of malignant neoplasm of breast: Secondary | ICD-10-CM

## 2018-04-30 ENCOUNTER — Other Ambulatory Visit: Payer: Medicare Other

## 2018-05-01 ENCOUNTER — Other Ambulatory Visit: Payer: Medicare Other

## 2018-05-07 ENCOUNTER — Other Ambulatory Visit: Payer: Self-pay | Admitting: Surgery

## 2018-05-07 DIAGNOSIS — C50912 Malignant neoplasm of unspecified site of left female breast: Secondary | ICD-10-CM | POA: Diagnosis not present

## 2018-05-08 ENCOUNTER — Telehealth: Payer: Self-pay | Admitting: Licensed Clinical Social Worker

## 2018-05-08 ENCOUNTER — Other Ambulatory Visit: Payer: Self-pay | Admitting: Surgery

## 2018-05-08 DIAGNOSIS — C50912 Malignant neoplasm of unspecified site of left female breast: Secondary | ICD-10-CM

## 2018-05-08 NOTE — Telephone Encounter (Signed)
An urgent genetic counseling appt has been scheduled for the pt to see Faith Rogue tomorrow, 12/5 at 1pm. Pt aware to arrive 15 minutes early.   Madison Hardin has also been scheduled to see Dr. Jana Hakim on 12/11 at 430pm. Aware to arrive 30 minutes early for labs at 4pm.

## 2018-05-09 ENCOUNTER — Encounter: Payer: Self-pay | Admitting: Licensed Clinical Social Worker

## 2018-05-09 ENCOUNTER — Inpatient Hospital Stay: Payer: Medicare Other | Attending: Oncology | Admitting: Licensed Clinical Social Worker

## 2018-05-09 ENCOUNTER — Inpatient Hospital Stay: Payer: Medicare Other

## 2018-05-09 DIAGNOSIS — Z87891 Personal history of nicotine dependence: Secondary | ICD-10-CM | POA: Insufficient documentation

## 2018-05-09 DIAGNOSIS — Z170 Estrogen receptor positive status [ER+]: Secondary | ICD-10-CM | POA: Insufficient documentation

## 2018-05-09 DIAGNOSIS — C50412 Malignant neoplasm of upper-outer quadrant of left female breast: Secondary | ICD-10-CM

## 2018-05-09 DIAGNOSIS — Z803 Family history of malignant neoplasm of breast: Secondary | ICD-10-CM | POA: Insufficient documentation

## 2018-05-09 DIAGNOSIS — Z171 Estrogen receptor negative status [ER-]: Secondary | ICD-10-CM | POA: Diagnosis not present

## 2018-05-09 DIAGNOSIS — Z8042 Family history of malignant neoplasm of prostate: Secondary | ICD-10-CM | POA: Insufficient documentation

## 2018-05-09 DIAGNOSIS — C50312 Malignant neoplasm of lower-inner quadrant of left female breast: Secondary | ICD-10-CM | POA: Insufficient documentation

## 2018-05-09 DIAGNOSIS — Z79899 Other long term (current) drug therapy: Secondary | ICD-10-CM | POA: Insufficient documentation

## 2018-05-09 DIAGNOSIS — Z800 Family history of malignant neoplasm of digestive organs: Secondary | ICD-10-CM | POA: Diagnosis not present

## 2018-05-09 NOTE — Progress Notes (Signed)
REFERRING PROVIDER: Blackman, Douglas, MD [address] [place], [place] [address]  PRIMARY PROVIDER:  Thekkekandam, Thomas J, MD  PRIMARY REASON FOR VISIT:  1. Malignant neoplasm of upper-outer quadrant of left breast in female, estrogen receptor positive (HCC)   2. Family history of breast cancer   3. Family history of prostate cancer   4. Family history of pancreatic cancer      HISTORY OF PRESENT ILLNESS:   Ms. Furry, a 77 y.o. female, was seen for a [facility] cancer genetics consultation at the request of Dr. Blackman due to a personal and family history of cancer.  Ms. Ly presents to clinic today to discuss the possibility of a hereditary predisposition to cancer, genetic testing, and to further clarify her future cancer risks, as well as potential cancer risks for family members.   In 2019, at the age of 77, Ms. Dowers was diagnosed with left breast cancer, ER+, PR+, Her2-.  She is scheduled for a breast MRI on 12/15.   HORMONAL RISK FACTORS:  Menarche was at age 15.  First live birth: no children.  OCP use for approximately 20 years. Ovaries intact: yes.  Hysterectomy: yes at age 40.  Menopausal status: postmenopausal.  HRT use: 5 years. Colonoscopy: yes; at least 3 tubular adenomas on past colonoscopies, she reports 10-12 cumulative total polyps. Mammogram within the last year: yes. Number of breast biopsies: 2.  Past Medical History:  Diagnosis Date  . Arthritis   . Cataract   . Family history of breast cancer   . Family history of pancreatic cancer   . Family history of prostate cancer   . GERD (gastroesophageal reflux disease)   . Hyperlipidemia   . Hypertension   . Pneumonia    as child    Past Surgical History:  Procedure Laterality Date  . ABDOMINAL HYSTERECTOMY    . AUGMENTATION MAMMAPLASTY     Pt had explants  . BREAST EXCISIONAL BIOPSY Left   . BREAST SURGERY    . COLONOSCOPY     last 2010 in [place]  . KIDNEY STONE SURGERY      flushed per pt.  . POLYPECTOMY     last 2010  . TONSILLECTOMY    . UPPER GASTROINTESTINAL ENDOSCOPY    . UTERINE FIBROID SURGERY  1980    Social History   Socioeconomic History  . Marital status: Single    Spouse name: Not on file  . Number of children: Not on file  . Years of education: Not on file  . Highest education level: Not on file  Occupational History  . Not on file  Social Needs  . Financial resource strain: Not on file  . Food insecurity:    Worry: Not on file    Inability: Not on file  . Transportation needs:    Medical: Not on file    Non-medical: Not on file  Tobacco Use  . Smoking status: Former Smoker  . Smokeless tobacco: Never Used  Substance and Sexual Activity  . Alcohol use: No  . Drug use: No  . Sexual activity: Not on file  Lifestyle  . Physical activity:    Days per week: Not on file    Minutes per session: Not on file  . Stress: Not on file  Relationships  . Social connections:    Talks on phone: Not on file    Gets together: Not on file    Attends religious service: Not on file      Active member of club or organization: Not on file    Attends meetings of clubs or organizations: Not on file    Relationship status: Not on file  Other Topics Concern  . Not on file  Social History Narrative  . Not on file     FAMILY HISTORY:  We obtained a detailed, 4-generation family history.  Significant diagnoses are listed below: Family History  Problem Relation Age of Onset  . Breast cancer Mother 3  . Prostate cancer Father 30  . Penile cancer Father   . Leukemia Paternal Grandmother   . Pancreatic cancer Paternal Aunt        dx late 29s  . Colon cancer Neg Hx   . Rectal cancer Neg Hx   . Stomach cancer Neg Hx   . Esophageal cancer Neg Hx   . Colon polyps Neg Hx    Ms. Buxbaum does not have children and she does not have siblings.  Ms. Carroll's mother was diagnosed with breast cancer at 2 and passed away at 12. She had two brothers and  three sisters but Ms. Grandmaison does not have information about them or her maternal cousins. Her maternal grandfather and grandmother both died around 57.  Ms. Rotunno's father was diagnosed with prostate cancer at 59 and died at 77. He had three sisters. Two are still living, one at 100 and the other is 102. Ms. Fife other paternal aunt was diagnosed with pancreatic cancer in her late 82's and died at 80. Ms. Char does not have information about her paternal cousins. Her paternal grandfather died at 78 and her paternal grandmother possibly had leukemia, and died at 50.  Ms. Sailer is unaware of previous family history of genetic testing for hereditary cancer risks. Patient's maternal ancestors are of [place] descent, and paternal ancestors are of [place] descent. There is no reported Ashkenazi Jewish ancestry. There is no known consanguinity.  GENETIC COUNSELING ASSESSMENT: Samanvi Cuccia is a 77 y.o. female with a personal and family history which is somewhat suggestive of a Hereditary Cancer Predisposition Syndrome. We, therefore, discussed and recommended the following at today's visit.   DISCUSSION: We discussed that about 5-10% of breast cancer cases are hereditary with most cases due to BRCA mutations.  Other genes associated with hereditary breast cancer cases include ATM, CHEK2 and PALB2.  We reviewed the characteristics, features and inheritance patterns of hereditary cancer syndromes. We also discussed genetic testing, including the appropriate family members to test, the process of testing, insurance coverage and turn-around-time for results. We discussed the implications of a negative, positive and/or variant of uncertain significant result. We recommended Ms. Bergstresser pursue genetic testing for the [facility] STAT Breast Cancer Panel + Common Hereditary Cancers Panel.  The STAT Breast cancer panel offered by [facility] includes sequencing and rearrangement analysis for the following 9 genes:  ATM,  BRCA1, BRCA2, CDH1, CHEK2, PALB2, PTEN, STK11 and TP53.    The Common Hereditary Cancers Panel offered by [facility] includes sequencing and/or deletion duplication testing of the following 47 genes: APC, ATM, AXIN2, BARD1, BMPR1A, BRCA1, BRCA2, BRIP1, CDH1, CDKN2A (p14ARF), CDKN2A (p16INK4a), CKD4, CHEK2, CTNNA1, DICER1, EPCAM (Deletion/duplication testing only), GREM1 (promoter region deletion/duplication testing only), KIT, MEN1, MLH1, MSH2, MSH3, MSH6, MUTYH, NBN, NF1, NHTL1, PALB2, PDGFRA, PMS2, POLD1, POLE, PTEN, RAD50, RAD51C, RAD51D, SDHB, SDHC, SDHD, SMAD4, SMARCA4. STK11, TP53, TSC1, TSC2, and VHL.  The following genes were evaluated for sequence changes only: SDHA and HOXB13 c.251G>A variant only.  We discussed that if  she is found to have a mutation in one of these genes, it may impact surgical decisions, and alter future medical management recommendations such as increased cancer screenings and consideration of risk reducing surgeries.  A positive result could also have implications for the patient's family members.  A Negative result would mean we were unable to identify a hereditary component to her cancer, but does not rule out the possibility of a hereditary basis for her cancer.  There could be mutations that are undetectable by current technology, or in genes not yet tested or identified to increase cancer risk.    We discussed the potential to find a Variant of Uncertain Significance or VUS.  These are variants that have not yet been identified as pathogenic or benign, and it is unknown if this variant is associated with increased cancer risk or if this is a normal finding.  Most VUS's are reclassified to benign or likely benign.   It should not be used to make medical management decisions. With time, we suspect the lab will determine the significance of any VUS's identified if any.   Based on Ms. Hinojosa's personal and family history of cancer, she meets NCCN medical criteria for genetic  testing. Despite that she meets criteria, she may still have an out of pocket cost. The lab will notify her of an OOP if any.  PLAN: After considering the risks, benefits, and limitations, Ms. Goranson  provided informed consent to pursue genetic testing and the blood sample was sent to [facility] for analysis of the Breast Cancer STAT Panel + Common Hereditary Cancers Panel. Initial results should be available within approximately 1 weeks' time, at which point they will be disclosed by telephone to Ms. Kempen, as will any additional recommendations warranted by these results. Ms. Honeycutt will receive a summary of her genetic counseling visit and a copy of her results once available. This information will also be available in Epic.  Lastly, we encouraged Ms. Szczygiel to remain in contact with cancer genetics annually so that we can continuously update the family history and inform her of any changes in cancer genetics and testing that may be of benefit for this family.   Ms.  Dicola's questions were answered to her satisfaction today. Our contact information was provided should additional questions or concerns arise. Thank you for the referral and allowing us to share in the care of your patient.   Brianna Cowan, MS Genetic Counselor [email or web address] Phone: [phone]   The patient was seen for a total of 35 minutes in face-to-face genetic counseling.   

## 2018-05-14 ENCOUNTER — Encounter: Payer: Self-pay | Admitting: Oncology

## 2018-05-14 ENCOUNTER — Other Ambulatory Visit: Payer: Self-pay

## 2018-05-14 DIAGNOSIS — C50412 Malignant neoplasm of upper-outer quadrant of left female breast: Secondary | ICD-10-CM

## 2018-05-14 DIAGNOSIS — Z170 Estrogen receptor positive status [ER+]: Principal | ICD-10-CM

## 2018-05-14 NOTE — Progress Notes (Signed)
[place]  [phone] [phone]     ID: Madison Hardin DOB: 02/09/1941  MR#: [id number]  NAT#:[id number]  Patient Care Team: Javier Glazier, MD as PCP - General (Internal Medicine) Coralie Keens, MD as Consulting Physician (General Surgery) Eppie Gibson, MD as Attending Physician (Radiation Oncology) , Virgie Dad, MD as Consulting Physician (Oncology) Pyrtle, Lajuan Lines, MD as Consulting Physician (Gastroenterology) OTHER MD:   CHIEF COMPLAINT: Estrogen receptor positive breast cancer   CURRENT TREATMENT: Awaiting definitive surgery   HISTORY OF CURRENT ILLNESS: Madison Hardin had routine screening mammography on 04/10/2018 showing a possible abnormality in the left breast. She underwent unilateral left diagnostic mammography with tomography and left breast ultrasonography at [facility] on 04/23/2018 showing: Breast Density Category B. In the middle third of the slightly upper and outer left breast is a group of heterogeneous calcifications spanning 2.5 x 2.5 x 1.4 cm on mammography. Ultrasound of the left axilla demonstrates multiple normal-appearing lymph nodes. No suspicious masses or lymph nodes with cortical thickening are identified by ultrasonography.   Accordingly on 04/25/2018 she proceeded to biopsy of the left breast area in question. The pathology from this procedure showed [id number]): Invasive mammary carcinoma, e-cadherin positive, grade 1 Prognostic indicators significant for: estrogen receptor, 100% positive and progesterone receptor, 100% positive, both with strong staining intensity. Proliferation marker Ki67 at 3%. HER2 negative immunohistochemical and morphometric analysis (1+).   The patientHardin subsequent history is as detailed below.   INTERVAL HISTORY: Madison Hardin was evaluated in the breast cancer clinic on 05/15/2018 .  Her case was also presented at the multidisciplinary breast cancer conference on  05/01/2018. At that time a preliminary plan was proposed: Breast conserving surgery with sentinel lymph node sampling, likely antiestrogens depending on size, plus or minus adjuvant radiation.  Dr. Ninfa Linden is also obtaining a breast MRI to facilitate surgical planning   REVIEW OF SYSTEMS: Madison Hardin is doing well overall. There were no specific symptoms leading to the original mammogram, which was routinely scheduled. She has arthritic pain, which is worse with exercise. She reports that for exercise, she swims everyday, completing mostly stretches for about an hour. The patient denies unusual headaches, visual changes, nausea, vomiting, stiff neck, dizziness, or gait imbalance. There has been no cough, phlegm production, or pleurisy, no chest pain or pressure, and no change in bowel or bladder habits. The patient denies fever, rash, bleeding, unexplained fatigue or unexplained weight loss. A detailed review of systems was otherwise entirely negative.    PAST MEDICAL HISTORY: Past Medical History:  Diagnosis Date  . Arthritis   . Cataract 03/2018   Surgery on both eyes in 03/2018  . Family history of breast cancer   . Family history of pancreatic cancer   . Family history of prostate cancer   . GERD (gastroesophageal reflux disease)   . Hyperlipidemia   . Hypertension   . Malignant neoplasm of upper-outer quadrant of left breast in female, estrogen receptor positive ([place]) 05/15/2018  . Pneumonia    as child     PAST SURGICAL HISTORY: Past Surgical History:  Procedure Laterality Date  . ABDOMINAL HYSTERECTOMY    . AUGMENTATION MAMMAPLASTY     Pt had explants  . BREAST EXCISIONAL BIOPSY Left   . BREAST SURGERY    . COLONOSCOPY     last 2010 in [place]  . KIDNEY STONE SURGERY     flushed per pt.  Marland Kitchen POLYPECTOMY     last 2010  .  TONSILLECTOMY    . UPPER GASTROINTESTINAL ENDOSCOPY    . UTERINE FIBROID SURGERY  1980     FAMILY HISTORY Family History  Problem Relation Age of  Onset  . Breast cancer Mother 33       without treatment  . Prostate cancer Father 17  . Penile cancer Father   . Leukemia Paternal Grandmother   . Pancreatic cancer Paternal Aunt        dx late 50s  . Colon cancer Neg Hx   . Rectal cancer Neg Hx   . Stomach cancer Neg Hx   . Esophageal cancer Neg Hx   . Colon polyps Neg Hx    The patientHardin father had a history of prostate and penile cancer; he died at age 35. Patients' mother died from breast cancer at age 69.  Apparently she can seal the cancer for a long time before bringing it to medical attention and then refused treatment.  The patient has no siblings. Patient denies anyone in her family having ovarian cancer. The patientHardin paternal aunt had pancreatic cancer.   GYNECOLOGIC HISTORY:  Menarche: 77 years old Age at first live birth:  [place] P: 0 LMP: No LMP recorded. Patient has had a hysterectomy. 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 She lives alone and has no pets. She has no children. She does not attend a church/synagog/mosque.   ADVANCED DIRECTIVES: To be discussed   HEALTH MAINTENANCE: Social History   Tobacco Use  . Smoking status: Former Research scientist (life sciences)  . Smokeless tobacco: Never Used  Substance Use Topics  . Alcohol use: No  . Drug use: No     Colonoscopy: yes, within the last year  PAP: >5 years ago  Bone density: yes, not recent   No Known Allergies  Current Outpatient Medications  Medication Sig Dispense Refill  . Cholecalciferol (VITAMIN D) 2000 UNITS CAPS Take 1 capsule by mouth daily.    Marland Kitchen Kelp 225 MCG TABS Take 1 tablet by mouth daily.    Marland Kitchen MAGNESIUM PO Take by mouth daily.    . Multiple Vitamins-Minerals (ICAPS AREDS 2 PO) Take by mouth.    . Omega-3 Fatty Acids (OMEGA III EPA+DHA PO) Take 1 each by mouth daily. 1514m/750 mg and pt takes 1 1/2  teaspoonsful daily    . omeprazole (PRILOSEC) 10 MG capsule Take 1 capsule (10 mg total) by mouth daily. 90 capsule 3  . telmisartan (MICARDIS) 40 MG tablet Take 1 tablet (40 mg total) by mouth daily. 90 tablet 3   Current Facility-Administered Medications  Medication Dose Route Frequency Provider Last Rate Last Dose  . 0.9 %  sodium chloride infusion  500 mL Intravenous Once Pyrtle, JLajuan Lines MD         OBJECTIVE: Morbidly obese white woman in no acute distress Vitals:   05/15/18 1602  BP: 132/78  Pulse: 98  Resp: 18  Temp: 97.7 F (36.5 C)  SpO2: 98%     Body mass index is 41.3 kg/m.   Wt Readings from Last 3 Encounters:  05/15/18 225 lb 12.8 oz (102.4 kg)  01/23/18 245 lb (111.1 kg)  01/10/18 228 lb 9.6 oz (103.7 kg)      ECOG FS:1 - Symptomatic but completely ambulatory  Ocular: Sclerae unicteric, pupils round and equal Lymphatic: No cervical or supraclavicular adenopathy Lungs no rales or rhonchi Heart  regular rate and rhythm Abd soft, obese, nontender, positive bowel sounds MSK no focal spinal tenderness, no joint edema Neuro: non-focal, well-oriented, appropriate affect Breasts: The right breast is unremarkable.  The left breast is status post remote benign biopsy.  It is also status post recent biopsy, with no palpable mass or ecchymosis noted.  Both axillae are benign.  LAB RESULTS:  CMP     Component Value Date/Time   NA 144 05/15/2018 1535   NA 142 04/17/2012   K 4.0 05/15/2018 1535   CL 107 05/15/2018 1535   CO2 28 05/15/2018 1535   GLUCOSE 110 (H) 05/15/2018 1535   BUN 21 05/15/2018 1535   BUN 19 04/17/2012   CREATININE 0.90 05/15/2018 1535   CREATININE 0.71 09/08/2016 1117   CALCIUM 9.7 05/15/2018 1535   PROT 7.1 05/15/2018 1535   ALBUMIN 3.8 05/15/2018 1535   AST 16 05/15/2018 1535   ALT 15 05/15/2018 1535   ALKPHOS 82 05/15/2018 1535   BILITOT 0.5 05/15/2018 1535   GFRNONAA >60 05/15/2018 1535   GFRAA >60 05/15/2018 1535    No results found  for: TOTALPROTELP, ALBUMINELP, A1GS, A2GS, BETS, BETA2SER, GAMS, MSPIKE, SPEI  No results found for: KPAFRELGTCHN, LAMBDASER, KAPLAMBRATIO  Lab Results  Component Value Date   WBC 8.5 05/15/2018   NEUTROABS 5.4 05/15/2018   HGB 14.4 05/15/2018   HCT 43.3 05/15/2018   MCV 91.4 05/15/2018   PLT 272 05/15/2018    _0 @  No results found for: LABCA2  No components found for: [id number]  No results for input(s): INR in the last 168 hours.  No results found for: LABCA2  No results found for: WUJ811  No results found for: BJY782  No results found for: NFA213  No results found for: CA2729  No components found for: HGQUANT  No results found for: CEA1 / No results found for: CEA1   No results found for: AFPTUMOR  No results found for: [place]  No results found for: PSA1  Appointment on 05/15/2018  Component Date Value Ref Range Status  . Sodium 05/15/2018 144  135 - 145 mmol/L Final  . Potassium 05/15/2018 4.0  3.5 - 5.1 mmol/L Final  . Chloride 05/15/2018 107  98 - 111 mmol/L Final  . CO2 05/15/2018 28  22 - 32 mmol/L Final  . Glucose, Bld 05/15/2018 110* 70 - 99 mg/dL Final  . BUN 05/15/2018 21  8 - 23 mg/dL Final  . Creatinine 05/15/2018 0.90  0.44 - 1.00 mg/dL Final  . Calcium 05/15/2018 9.7  8.9 - 10.3 mg/dL Final  . Total Protein 05/15/2018 7.1  6.5 - 8.1 g/dL Final  . Albumin 05/15/2018 3.8  3.5 - 5.0 g/dL Final  . AST 05/15/2018 16  15 - 41 U/L Final  . ALT 05/15/2018 15  0 - 44 U/L Final  . Alkaline Phosphatase 05/15/2018 82  38 - 126 U/L Final  . Total Bilirubin 05/15/2018 0.5  0.3 - 1.2 mg/dL Final  . GFR, Est Non Af Am 05/15/2018 >60  >60 mL/min Final  . GFR, Est AFR Am 05/15/2018 >60  >60 mL/min Final  . Anion gap 05/15/2018 9  5 - 15 Final   Performed at [facility], [place] [address] [place], [place] [address]  . WBC Count 05/15/2018 8.5  4.0 - 10.5 K/uL Final  . RBC 05/15/2018 4.74  3.87 - 5.11 MIL/uL Final  .  Hemoglobin 05/15/2018 14.4  12.0 - 15.0 g/dL Final  . HCT 05/15/2018  43.3  36.0 - 46.0 % Final  . MCV 05/15/2018 91.4  80.0 - 100.0 fL Final  . MCH 05/15/2018 30.4  26.0 - 34.0 pg Final  . MCHC 05/15/2018 33.3  30.0 - 36.0 g/dL Final  . RDW 05/15/2018 13.2  11.5 - 15.5 % Final  . Platelet Count 05/15/2018 272  150 - 400 K/uL Final  . nRBC 05/15/2018 0.0  0.0 - 0.2 % Final  . Neutrophils Relative % 05/15/2018 63  % Final  . Neutro Abs 05/15/2018 5.4  1.7 - 7.7 K/uL Final  . Lymphocytes Relative 05/15/2018 28  % Final  . Lymphs Abs 05/15/2018 2.4  0.7 - 4.0 K/uL Final  . Monocytes Relative 05/15/2018 7  % Final  . Monocytes Absolute 05/15/2018 0.6  0.1 - 1.0 K/uL Final  . Eosinophils Relative 05/15/2018 1  % Final  . Eosinophils Absolute 05/15/2018 0.1  0.0 - 0.5 K/uL Final  . Basophils Relative 05/15/2018 1  % Final  . Basophils Absolute 05/15/2018 0.0  0.0 - 0.1 K/uL Final  . Immature Granulocytes 05/15/2018 0  % Final  . Abs Immature Granulocytes 05/15/2018 0.02  0.00 - 0.07 K/uL Final   Performed at [facility], [place] [address], [place], [place] [address]    (this displays the last labs from the last 3 days)  No results found for: TOTALPROTELP, ALBUMINELP, A1GS, A2GS, BETS, BETA2SER, GAMS, MSPIKE, SPEI (this displays SPEP labs)  No results found for: KPAFRELGTCHN, LAMBDASER, KAPLAMBRATIO (kappa/lambda light chains)  No results found for: HGBA, HGBA2QUANT, HGBFQUANT, HGBSQUAN (Hemoglobinopathy evaluation)   No results found for: LDH  No results found for: IRON, TIBC, IRONPCTSAT (Iron and TIBC)  No results found for: FERRITIN  Urinalysis No results found for: COLORURINE, APPEARANCEUR, LABSPEC, PHURINE, GLUCOSEU, HGBUR, BILIRUBINUR, KETONESUR, PROTEINUR, UROBILINOGEN, NITRITE, LEUKOCYTESUR   STUDIES:  Mm Digital Diagnostic Unilat L  Result Date: 04/23/2018 CLINICAL DATA:  77 year old patient recalled from recent screening mammogram for  evaluation of left breast calcifications. EXAM: DIGITAL DIAGNOSTIC LEFT MAMMOGRAM WITH CAD COMPARISON:  January 22, 2017 ACR Breast Density Category b: There are scattered areas of fibroglandular density. FINDINGS: In the middle third of the slightly upper and outer left breast is a group of heterogeneous calcifications spanning 2.5 x 2.5 x 1.4 cm. Mammographic images were processed with CAD. IMPRESSION: Grouped heterogeneous calcifications in the upper-outer left breast. Ductal carcinoma in situ cannot be excluded. RECOMMENDATION: Left breast stereotactic biopsy is recommended and has been scheduled for Thursday April 25, 2018. I have discussed the findings and recommendations with the patient. Results were also provided in writing at the conclusion of the visit. If applicable, a reminder letter will be sent to the patient regarding the next appointment. BI-RADS CATEGORY  4: Suspicious. Electronically Signed   By: Curlene Dolphin M.D.   On: 04/23/2018 12:45   [place] Axilla Left  Result Date: 04/29/2018 CLINICAL DATA:  77 year old female recently diagnosed with left breast cancer presenting for left axillary ultrasound to evaluate lymph nodes. EXAM: ULTRASOUND OF THE LEFT AXILLA COMPARISON:  Prior exams. FINDINGS: Ultrasound of the left axilla demonstrates multiple normal-appearing lymph nodes. No suspicious masses or lymph nodes with cortical thickening are identified. IMPRESSION: No evidence of left axillary lymphadenopathy. RECOMMENDATION: Treatment plan for known left breast cancer. I have discussed the findings and recommendations with the patient. Results were also provided in writing at the conclusion of the visit. If applicable, a reminder letter will be sent to the patient regarding the next appointment.  BI-RADS CATEGORY  1: Negative. Electronically Signed   By: Ammie Ferrier M.D.   On: 04/29/2018 10:41   Mm Clip Placement Left  Result Date: 04/25/2018 CLINICAL DATA:  Post biopsy mammogram of the  left breast for clip placement. EXAM: DIAGNOSTIC LEFT MAMMOGRAM POST STEREOTACTIC BIOPSY COMPARISON:  Previous exam(s). FINDINGS: Mammographic images were obtained following stereotactic guided biopsy of calcifications left breast. The coil shaped biopsy marking clip migrated about 3.5 cm medial to the site of biopsy. Spot compression magnification views over the region demonstrate numerous residual calcifications which may be used for bracketing if excision is required. IMPRESSION: The coil shaped biopsy marking clip is migrated 3.5 cm medial to the biopsy site. Residual calcifications are present for bracketing if necessary. Final Assessment: Post Procedure Mammograms for Marker Placement Electronically Signed   By: Ammie Ferrier M.D.   On: 04/25/2018 09:37   Mm Lt Breast Bx W Loc Dev 1st Lesion Image Bx Spec Stereo Guide  Addendum Date: 04/26/2018   ADDENDUM REPORT: 04/26/2018 14:15 ADDENDUM: Pathology revealed GRADE I INVASIVE MAMMARY CARCINOMA, MAMMARY CARCINOMA IN SITU WITH CALCIFICATIONS of the Left breast, upper outer quadrant. This was found to be concordant by Dr. Ammie Ferrier. Pathology results were discussed with the patient by telephone. The patient reported doing well after the biopsy with tenderness at the site. Post biopsy instructions and care were reviewed and questions were answered. The patient was encouraged to call [facility] for any additional concerns. Surgical consultation has been arranged with [facility] Ned[facility] on May 07, 2018. The patient is scheduled for a Left axillary ultrasound on April 29, 2018. Surgical consideration: The biopsy marking clip is significantly displaced. The patient will need the calcifications bracketed for localization for lumpectomy if breast conservation is desired. Pathology results reported by Terie Purser, RN on 04/26/2018. Electronically Signed   By: Ammie Ferrier M.D.   On:  04/26/2018 14:15   Result Date: 04/26/2018 CLINICAL DATA:  77 year old female presenting for stereotactic biopsy of left breast calcifications. EXAM: LEFT BREAST STEREOTACTIC CORE NEEDLE BIOPSY COMPARISON:  Previous exams. FINDINGS: The patient and I discussed the procedure of stereotactic-guided biopsy including benefits and alternatives. We discussed the high likelihood of a successful procedure. We discussed the risks of the procedure including infection, bleeding, tissue injury, clip migration, and inadequate sampling. Informed written consent was given. The usual time out protocol was performed immediately prior to the procedure. Using sterile technique and 1% Lidocaine as local anesthetic, under stereotactic guidance, a 9 gauge vacuum assisted device was used to perform core needle biopsy of calcifications in the upper-outer quadrant of the left breast using a lateral approach. Specimen radiograph was performed showing calcifications within several core samples. Specimens with calcifications are identified for pathology. Lesion quadrant: Upper-outer quadrant At the conclusion of the procedure, a coil shaped tissue marker clip was deployed into the biopsy cavity. Follow-up 2-view mammogram was performed and dictated separately. IMPRESSION: Stereotactic-guided biopsy of calcifications in the upper-outer left breast. No apparent complications. Electronically Signed: By: Ammie Ferrier M.D. On: 04/25/2018 09:36    ELIGIBLE FOR AVAILABLE RESEARCH PROTOCOL: no   ASSESSMENT: 77 y.o. 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 Specifically we reviewed the biology of the patientHardin diagnosis and the specifics of her situation.  We first reviewed the fact that cancer is not one disease but more than 100 different diseases and that it is important to keep them separate-- otherwise when friends and relatives discuss their own cancer experiences with Madison Hardin confusion can result. Similarly we explained that if breast cancer spreads to the bone or liver, the patient would not have bone cancer or liver cancer, but breast cancer in the bone and breast cancer in the liver: one cancer in three places-- not 3 different cancers which otherwise would have to be treated in 3 different ways.  We discussed the difference between local and systemic therapy. In terms of loco-regional treatment, lumpectomy plus radiation is equivalent to mastectomy as far as survival is concerned. For this reason, and because the cosmetic results are generally superior, we recommend breast conserving surgery.  However there is a question whether this may be feasible cosmetically and an MRI has been ordered to facilitate surgical planning  We then discussed the rationale for systemic therapy. There is some risk that this cancer may have already spread to other parts of her body. Patients frequently ask at this point about bone scans, CAT scans and PET scans to find out if they have occult breast cancer somewhere else. The problem is that in early stage disease we are much more likely to find false positives then true cancers and this would expose the patient to unnecessary procedures as well as unnecessary radiation. Scans cannot answer the question the patient really would like to know, which is  whether she has microscopic disease elsewhere in her body. For those reasons we do not recommend them.  Of course we would proceed to aggressive evaluation of any symptoms that might suggest metastatic disease, but that is not the case here.  Next we went over the options for systemic therapy which are anti-estrogens, anti-HER-2 immunotherapy, and chemotherapy. Madison Hardin does not meet criteria for anti-HER-2 immunotherapy. She is a good candidate for anti-estrogens.  The question of chemotherapy is more complicated. Chemotherapy is most effective in rapidly growing, aggressive tumors. It is much less effective in low-grade, slow growing cancers, like Madison Hardin. For that reason we are going to request an Oncotype from the definitive surgical sample, as suggested by NCCN guidelines. That will help [place] make a definitive decision regarding chemotherapy in this case, but Madison Hardin understands my expectation is that chemotherapy would not be useful in her case  Madison Hardin also qualifies for genetics testing. In patients who carry a deleterious mutation [for example in a  BRCA gene], the risk of a new breast cancer developing in the future may be sufficiently great that the patient may choose bilateral mastectomies. However if she wishes to keep her breasts in that situation it is safe to do so. That would require intensified screening, which generally means not only yearly mammography but a yearly breast MRI as well.   In short we propose to start with surgery, obtain an Oncotype assuming the invasive component is greater than 1 cm, then consider radiation and/or antiestrogens depending on the final  pathology report.  Madison Hardin let me know that unless she has motivating numeric prognoses she is unlikely to accept any adjuvant treatment.  The Oncotype will be particularly helpful in that regard  Madison Hardin has a good understanding of the overall plan. She agrees with it. She knows the goal of treatment in her case is cure. She will  call with any problems that may develop before her next visit here, which will be late January 2020.   , Virgie Dad, MD  05/15/18 5:13 PM Medical Oncology and Hematology [facility] [address] [place],  [address] Tel. [phone]    Fax. [phone]    I, Jacqualyn Posey am acting as a Education administrator for Chauncey Cruel, MD.   I, Lurline Del MD, have reviewed the above documentation for accuracy and completeness, and I agree with the above.

## 2018-05-14 NOTE — Progress Notes (Signed)
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  Receptor Status: ER(100%), PR (100%), Her2-neu (NEG), Ki-(3%)  Did patient present with symptoms or was this found on screening mammography?: It was found on a screening mammogram.   Past/Anticipated interventions by surgeon, if any: Dr. Ninfa Linden - to be scheduled this week.   Past/Anticipated interventions by medical oncology, if any:  05/15/18 Dr. Jana Hakim (1) genetics testing pending  (2) definitive surgery to follow  (3) Oncotype DX to be sent from the definitive surgical sample: Chemotherapy not anticipated  (4) consider adjuvant radiation  (5) antiestrogens to follow at the completion of local treatment depending on the size of invasive component  Lymphedema issues, if any:  N/A  Pain issues, if any: Left knee/foot pain. She reports that this pain is chronic and mild.    SAFETY ISSUES:  Prior radiation? No  Pacemaker/ICD? No  Possible current pregnancy? No  Is the patient on methotrexate? No  Current Complaints / other details:    BP 131/77 (BP Location: Left Arm, Patient Position: Sitting)   Pulse 65   Temp 97.7 F (36.5 C) (Oral)   Resp 18   Ht _0  (1.6 m)   Wt 222 lb 4 oz (100.8 kg)   SpO2 99%   BMI 39.37 kg/m    Wt Readings from Last 3 Encounters:  05/21/18 222 lb 4 oz (100.8 kg)  05/15/18 225 lb 12.8 oz (102.4 kg)  01/23/18 245 lb (111.1 kg)      Jaeleigh Monaco, Stephani Police, RN 05/14/2018,3:55 PM

## 2018-05-15 ENCOUNTER — Encounter: Payer: Self-pay | Admitting: Oncology

## 2018-05-15 ENCOUNTER — Inpatient Hospital Stay: Payer: Medicare Other

## 2018-05-15 ENCOUNTER — Inpatient Hospital Stay (HOSPITAL_BASED_OUTPATIENT_CLINIC_OR_DEPARTMENT_OTHER): Payer: Medicare Other | Admitting: Oncology

## 2018-05-15 DIAGNOSIS — Z8042 Family history of malignant neoplasm of prostate: Secondary | ICD-10-CM | POA: Diagnosis not present

## 2018-05-15 DIAGNOSIS — C50412 Malignant neoplasm of upper-outer quadrant of left female breast: Secondary | ICD-10-CM | POA: Diagnosis not present

## 2018-05-15 DIAGNOSIS — Z170 Estrogen receptor positive status [ER+]: Secondary | ICD-10-CM | POA: Insufficient documentation

## 2018-05-15 DIAGNOSIS — Z87891 Personal history of nicotine dependence: Secondary | ICD-10-CM

## 2018-05-15 DIAGNOSIS — Z800 Family history of malignant neoplasm of digestive organs: Secondary | ICD-10-CM | POA: Diagnosis not present

## 2018-05-15 DIAGNOSIS — Z79899 Other long term (current) drug therapy: Secondary | ICD-10-CM

## 2018-05-15 DIAGNOSIS — C50312 Malignant neoplasm of lower-inner quadrant of left female breast: Secondary | ICD-10-CM | POA: Diagnosis not present

## 2018-05-15 DIAGNOSIS — Z803 Family history of malignant neoplasm of breast: Secondary | ICD-10-CM | POA: Diagnosis not present

## 2018-05-15 HISTORY — DX: Malignant neoplasm of upper-outer quadrant of left female breast: C50.412

## 2018-05-15 HISTORY — DX: Estrogen receptor positive status (ER+): Z17.0

## 2018-05-15 LAB — CBC WITH DIFFERENTIAL (CANCER CENTER ONLY)
ABS IMMATURE GRANULOCYTES: 0.02 10*3/uL (ref 0.00–0.07)
BASOS PCT: 1 %
Basophils Absolute: 0 10*3/uL (ref 0.0–0.1)
EOS ABS: 0.1 10*3/uL (ref 0.0–0.5)
Eosinophils Relative: 1 %
HCT: 43.3 % (ref 36.0–46.0)
Hemoglobin: 14.4 g/dL (ref 12.0–15.0)
IMMATURE GRANULOCYTES: 0 %
LYMPHS ABS: 2.4 10*3/uL (ref 0.7–4.0)
Lymphocytes Relative: 28 %
MCH: 30.4 pg (ref 26.0–34.0)
MCHC: 33.3 g/dL (ref 30.0–36.0)
MCV: 91.4 fL (ref 80.0–100.0)
MONO ABS: 0.6 10*3/uL (ref 0.1–1.0)
MONOS PCT: 7 %
NEUTROS PCT: 63 %
Neutro Abs: 5.4 10*3/uL (ref 1.7–7.7)
Platelet Count: 272 10*3/uL (ref 150–400)
RBC: 4.74 MIL/uL (ref 3.87–5.11)
RDW: 13.2 % (ref 11.5–15.5)
WBC: 8.5 10*3/uL (ref 4.0–10.5)
nRBC: 0 % (ref 0.0–0.2)

## 2018-05-15 LAB — CMP (CANCER CENTER ONLY)
ALBUMIN: 3.8 g/dL (ref 3.5–5.0)
ALK PHOS: 82 U/L (ref 38–126)
ALT: 15 U/L (ref 0–44)
AST: 16 U/L (ref 15–41)
Anion gap: 9 (ref 5–15)
BUN: 21 mg/dL (ref 8–23)
CO2: 28 mmol/L (ref 22–32)
Calcium: 9.7 mg/dL (ref 8.9–10.3)
Chloride: 107 mmol/L (ref 98–111)
Creatinine: 0.9 mg/dL (ref 0.44–1.00)
GFR, Est AFR Am: >60 mL/min (ref >60)
GFR, Estimated: >60 mL/min (ref >60)
GLUCOSE: 110 mg/dL — AB (ref 70–99)
POTASSIUM: 4 mmol/L (ref 3.5–5.1)
Sodium: 144 mmol/L (ref 135–145)
Total Bilirubin: 0.5 mg/dL (ref 0.3–1.2)
Total Protein: 7.1 g/dL (ref 6.5–8.1)

## 2018-05-16 ENCOUNTER — Encounter: Payer: Self-pay | Admitting: *Deleted

## 2018-05-19 ENCOUNTER — Ambulatory Visit
Admission: RE | Admit: 2018-05-19 | Discharge: 2018-05-19 | Disposition: A | Discharge status: Home / Self Care | Payer: Medicare Other | Source: Ambulatory Visit | Attending: Surgery | Admitting: Surgery

## 2018-05-19 DIAGNOSIS — D0511 Intraductal carcinoma in situ of right breast: Secondary | ICD-10-CM | POA: Diagnosis not present

## 2018-05-19 DIAGNOSIS — C50912 Malignant neoplasm of unspecified site of left female breast: Secondary | ICD-10-CM

## 2018-05-19 DIAGNOSIS — D0512 Intraductal carcinoma in situ of left breast: Secondary | ICD-10-CM | POA: Diagnosis not present

## 2018-05-19 MED ORDER — GADOBUTROL 1 MMOL/ML IV SOLN
10.0000 mL | Freq: Once | INTRAVENOUS | Status: AC | PRN
  Administered 2018-05-19: 10 mL via INTRAVENOUS

## 2018-05-20 ENCOUNTER — Encounter: Payer: Self-pay | Admitting: Licensed Clinical Social Worker

## 2018-05-20 ENCOUNTER — Ambulatory Visit: Payer: Self-pay | Admitting: Licensed Clinical Social Worker

## 2018-05-20 ENCOUNTER — Other Ambulatory Visit: Payer: Self-pay | Admitting: Surgery

## 2018-05-20 ENCOUNTER — Telehealth: Payer: Self-pay | Admitting: Licensed Clinical Social Worker

## 2018-05-20 DIAGNOSIS — Z7189 Other specified counseling: Secondary | ICD-10-CM | POA: Diagnosis not present

## 2018-05-20 DIAGNOSIS — Z1379 Encounter for other screening for genetic and chromosomal anomalies: Secondary | ICD-10-CM | POA: Insufficient documentation

## 2018-05-20 DIAGNOSIS — Z853 Personal history of malignant neoplasm of breast: Secondary | ICD-10-CM

## 2018-05-20 DIAGNOSIS — I10 Essential (primary) hypertension: Secondary | ICD-10-CM | POA: Diagnosis not present

## 2018-05-20 NOTE — Telephone Encounter (Signed)
Revealed negative genetic testing.  Revealed that a VUS in  APC was identified. This normal result is reassuring and indicates that it is unlikely Madison Hardin's cancer is due to a hereditary cause.  It is unlikely that there is an increased risk of another cancer due to a mutation in one of these genes.  However, genetic testing is not perfect, and cannot definitively rule out a hereditary cause.  It will be important for her to keep in contact with genetics to learn if any additional testing may be needed in the future.  Her paternal aunts could potentially benefit from genetic counseling and testing.

## 2018-05-20 NOTE — Progress Notes (Signed)
HPI:  Ms. Suppa was previously seen in the [place] clinic on 05/09/2018 due to a personal and family history of cancer and concerns regarding a hereditary predisposition to cancer. Please refer to our prior cancer genetics clinic note for more information regarding Ms. Saur's medical, social and family histories, and our assessment and recommendations, at the time. Ms. Zoeller's recent genetic test results were disclosed to her, as well as recommendations warranted by these results. These results and recommendations are discussed in more detail below.  FAMILY HISTORY:  We obtained a detailed, 4-generation family history.  Significant diagnoses are listed below: Family History  Problem Relation Age of Onset  . Breast cancer Mother 12       without treatment  . Prostate cancer Father 50  . Penile cancer Father   . Leukemia Paternal Grandmother   . Pancreatic cancer Paternal Aunt        dx late 60s  . Colon cancer Neg Hx   . Rectal cancer Neg Hx   . Stomach cancer Neg Hx   . Esophageal cancer Neg Hx   . Colon polyps Neg Hx    Ms. Dueitt does not have children and she does not have siblings.  Ms. Ekstein's mother was diagnosed with breast cancer at 67 and passed away at 34. She had two brothers and three sisters but Ms. Barletta does not have information about them or her maternal cousins. Her maternal grandfather and grandmother both died around 78.  Ms. Arch's father was diagnosed with prostate cancer at 52 and died at 26. He had three sisters. Two are still living, one at 100 and the other is 102. Ms. Winslett other paternal aunt was diagnosed with pancreatic cancer in her late 57's and died at 65. Ms. Diemer does not have information about her paternal cousins. Her paternal grandfather died at 60 and her paternal grandmother possibly had leukemia, and died at 26.  Ms. Ronda is unaware of previous family history of genetic testing for hereditary cancer risks. Patient's maternal ancestors  are of [place] descent, and paternal ancestors are of [place] descent. There is no reported Ashkenazi Jewish ancestry. There is no known consanguinity.  GENETIC TEST RESULTS: Genetic testing performed through [facility]'s Breast Cancer STAT + Common Hereditary Cancers Panel reported out on 05/17/2018 showed no pathogenic mutations. The STAT Breast cancer panel offered by [facility] includes sequencing and rearrangement analysis for the following 9 genes:  ATM, BRCA1, BRCA2, CDH1, CHEK2, PALB2, PTEN, STK11 and TP53.  The Common Hereditary Cancers Panel offered by [facility] includes sequencing and/or deletion duplication testing of the following 47 genes: APC, ATM, AXIN2, BARD1, BMPR1A, BRCA1, BRCA2, BRIP1, CDH1, CDKN2A (p14ARF), CDKN2A (p16INK4a), CKD4, CHEK2, CTNNA1, DICER1, EPCAM (Deletion/duplication testing only), GREM1 (promoter region deletion/duplication testing only), KIT, MEN1, MLH1, MSH2, MSH3, MSH6, MUTYH, NBN, NF1, NHTL1, PALB2, PDGFRA, PMS2, POLD1, POLE, PTEN, RAD50, RAD51C, RAD51D, SDHB, SDHC, SDHD, SMAD4, SMARCA4. STK11, TP53, TSC1, TSC2, and VHL.  The following genes were evaluated for sequence changes only: SDHA and HOXB13 c.251G>A variant only.  A variant of uncertain significance (VUS) in a gene called APC was also noted.   The test report will be scanned into EPIC and will be located under the Molecular Pathology section of the Results Review tab. A portion of the result report is included below for reference.     We discussed with Ms. Galluzzo that because current genetic testing is not perfect, it is possible there may be a gene mutation in one  of these genes that current testing cannot detect, but that chance is small.  We also discussed, that there could be another gene that has not yet been discovered, or that we have not yet tested, that is responsible for the cancer diagnoses in the family. It is also possible there is a hereditary cause for the cancer in the family that Ms. Yeatman did not  inherit and therefore was not identified in her testing.  Therefore, it is important to remain in touch with cancer genetics in the future so that we can continue to offer Ms. Holst the most up to date genetic testing.   Regarding the VUS in APC: At this time, it is unknown if this variant is associated with increased cancer risk or if this is a normal finding, but most variants such as this get reclassified to being inconsequential. It should not be used to make medical management decisions. With time, we suspect the lab will determine the significance of this variant, if any. If we do learn more about it, we will try to contact Ms. Corado to discuss it further. However, it is important to stay in touch with [place] periodically and keep the address and phone number up to date.  ADDITIONAL GENETIC TESTING: We discussed with Ms. Loree that her genetic testing was fairly extensive.  If there are are genes identified to increase cancer risk that can be analyzed in the future, we would be happy to discuss and coordinate this testing at that time.    CANCER SCREENING RECOMMENDATIONS: Ms. Compston's test result is considered negative (normal).  This means that we have not identified a hereditary cause for her personal and family history of cancer at this time.   This normal indicates that it is unlikely Ms. Krasinski has an increased risk of cancer due to a mutation in one of these genes. While reassuring, this does not definitively rule out a hereditary predisposition to cancer. It is still possible that there could be genetic mutations that are undetectable by current technology, or genetic mutations in genes that have not been tested or identified to increase cancer risk.  Therefore, it is recommended she continue to follow the cancer management and screening guidelines provided by her oncology and primary healthcare provider. An individual's cancer risk is not determined by genetic test results alone.  Overall cancer risk  assessment includes additional factors such as personal medical history, family history, etc.  These should be used to make a personalized plan for cancer prevention and surveillance.    RECOMMENDATIONS FOR FAMILY MEMBERS:  Relatives in this family might be at some increased risk of developing cancer, over the general population risk, simply due to the family history of cancer.  We recommended women in this family have a yearly mammogram beginning at age 57, or 73 years younger than the earliest onset of cancer, an annual clinical breast exam, and perform monthly breast self-exams. Women in this family should also have a gynecological exam as recommended by their primary provider. All family members should have a colonoscopy as directed by their doctors.  All family members should inform their physicians about the family history of cancer so their doctors can make the most appropriate screening recommendations for them.   It is also possible there is a hereditary cause for the cancer in Ms. Mans's family that she did not inherit and therefore was not identified in her.  We recommended her paternal aunts, have genetic counseling and testing. Ms. Treese will let  us know if we can be of any assistance in coordinating genetic counseling and/or testing for these family members.   FOLLOW-UP: Lastly, we discussed with Ms. Omura that cancer genetics is a rapidly advancing field and it is possible that new genetic tests will be appropriate for her and/or her family members in the future. We encouraged her to remain in contact with cancer genetics on an annual basis so we can update her personal and family histories and let her know of advances in cancer genetics that may benefit this family.   Our contact number was provided. Ms. Knotts's questions were answered to her satisfaction, and she knows she is welcome to call us at anytime with additional questions or concerns.  Faith Rogue, MS Genetic  Counselor [email or web address] Phone: [phone]

## 2018-05-21 ENCOUNTER — Encounter: Payer: Self-pay | Admitting: Radiation Oncology

## 2018-05-21 ENCOUNTER — Other Ambulatory Visit: Payer: Self-pay

## 2018-05-21 ENCOUNTER — Other Ambulatory Visit: Payer: Self-pay | Admitting: Surgery

## 2018-05-21 ENCOUNTER — Ambulatory Visit
Admission: RE | Admit: 2018-05-21 | Discharge: 2018-05-21 | Disposition: A | Discharge status: Home / Self Care | Payer: Medicare Other | Source: Ambulatory Visit | Attending: Radiation Oncology | Admitting: Radiation Oncology

## 2018-05-21 DIAGNOSIS — Z853 Personal history of malignant neoplasm of breast: Secondary | ICD-10-CM

## 2018-05-21 DIAGNOSIS — M1990 Unspecified osteoarthritis, unspecified site: Secondary | ICD-10-CM | POA: Insufficient documentation

## 2018-05-21 DIAGNOSIS — Z803 Family history of malignant neoplasm of breast: Secondary | ICD-10-CM | POA: Diagnosis not present

## 2018-05-21 DIAGNOSIS — Z170 Estrogen receptor positive status [ER+]: Secondary | ICD-10-CM | POA: Insufficient documentation

## 2018-05-21 DIAGNOSIS — C50412 Malignant neoplasm of upper-outer quadrant of left female breast: Secondary | ICD-10-CM

## 2018-05-21 DIAGNOSIS — Z808 Family history of malignant neoplasm of other organs or systems: Secondary | ICD-10-CM | POA: Diagnosis not present

## 2018-05-21 DIAGNOSIS — Z79899 Other long term (current) drug therapy: Secondary | ICD-10-CM | POA: Diagnosis not present

## 2018-05-21 NOTE — Progress Notes (Signed)
Radiation Oncology         [phone] ________________________________  Initial outpatient Consultation  Name: Madison Hardin MRN: [id number]  Date: 05/21/2018  DOB: 1941-05-02  GY:KZLDJT, Christian Mate, MD  Coralie Keens, MD   REFERRING PHYSICIAN: Coralie Keens, MD  DIAGNOSIS:    ICD-10-CM   1. 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 female who presented with breast abnormality on the following imaging: routine screening mammography on the date of 04/10/2018.  Symptoms, if any, at that time, were: no symptoms at the time.    Diagnostic Mammogram of breast on 04/23/2018 revealed: Breast Density Category B. In the middle third of the slightly upper and outer left breast is a group of heterogeneous calcifications spanning 2.5 x 2.5 x 1.4 cm on mammography. [place] of left axilla on 04/29/2018 showed: No evidence of left axillary lymphadenopathy.   Biopsy on date of 04/25/2018 showed Breast, left, needle core biopsy, upper outer quadrant with invasive mammary carcinoma. Mammary carcinoma in situ with calcifications. ER status: 100% positive; PR status 100% positive, Her2 status negative; Grade 1.  She had a MRI Bilateral breast on 05/19/2018 that showed: Linear enhancement in the UPPER-OUTER QUADRANT of the LEFT breast corresponding to the known malignancy. For localization purposes, the coil shaped clip marks the MEDIAL aspect of disease and residual calcifications in the LATERAL portion of the LEFT breast mark the LATERAL aspect of disease and can be used for localization purposes. RIGHT breast is negative.  She has a follow up appointment with Dr. Ninfa Linden later this week.  She will have genetics testing, definitive surgery, consideration of adjuvant radiation, and antiestrogens to follow.    She reports associated baseline subcutaneous lumps throughout the body, particularly in the arms. She denies HA,  SOB, trouble urinating, trouble with bowel movements, bleeding, and unintentional weight gain or weight loss. 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   I personally reviewed the patient's imaging prior to today's visit.          PREVIOUS RADIATION THERAPY: No  PAST MEDICAL HISTORY:  has a past medical history of Arthritis, Cataract (03/2018), Family history of breast cancer, Family history of pancreatic cancer, Family history of prostate cancer, GERD (gastroesophageal reflux disease), Hyperlipidemia, Hypertension, Malignant neoplasm of upper-outer quadrant of left breast in female, estrogen receptor positive ([place]) (05/15/2018), and Pneumonia.    PAST SURGICAL HISTORY: Past Surgical History:  Procedure Laterality Date  . ABDOMINAL HYSTERECTOMY    . AUGMENTATION MAMMAPLASTY     Pt had explants  . BREAST EXCISIONAL BIOPSY Left   . BREAST SURGERY    . COLONOSCOPY     last 2010 in [place]  . KIDNEY STONE SURGERY     flushed per pt.  Marland Kitchen POLYPECTOMY     last 2010  . TONSILLECTOMY    . UPPER GASTROINTESTINAL ENDOSCOPY    . UTERINE FIBROID SURGERY  1980    FAMILY HISTORY: family history includes Breast cancer (age of onset: 18) in her mother; Leukemia in her paternal grandmother; Pancreatic cancer in her paternal aunt; Penile cancer in her father; Prostate cancer (age of onset: 26) in her father.  SOCIAL  HISTORY:  reports that she quit smoking about 39 years ago. She has never used smokeless tobacco. She reports that she does not drink alcohol or use drugs.  ALLERGIES: Patient has no known allergies.  MEDICATIONS:  Current Outpatient Medications  Medication Sig Dispense Refill  . Cholecalciferol (VITAMIN D) 2000 UNITS CAPS Take 1 capsule  by mouth daily.    Marland Kitchen MAGNESIUM PO Take by mouth daily.    . Multiple Vitamins-Minerals (ICAPS AREDS 2 PO) Take by mouth.    Marland Kitchen omeprazole (PRILOSEC) 10 MG capsule Take 1 capsule (10 mg total) by mouth daily. 90 capsule 3  . telmisartan (MICARDIS) 40 MG tablet Take 1 tablet (40 mg total) by mouth daily. 90 tablet 3  . Kelp 225 MCG TABS Take 1 tablet by mouth daily.     Current Facility-Administered Medications  Medication Dose Route Frequency Provider Last Rate Last Dose  . 0.9 %  sodium chloride infusion  500 mL Intravenous Once Pyrtle, Lajuan Lines, MD        REVIEW OF SYSTEMS: A 10+ POINT REVIEW OF SYSTEMS WAS OBTAINED including   psychiatry, cardiac, respiratory, lymph, extremities, GI, GU, Musculoskeletal, constitutional, breasts,  HEENT.  All pertinent positives are noted in the HPI.  All others are negative.   PHYSICAL EXAM:  height is _0  (1.6 m) and weight is 222 lb 4 oz (100.8 kg). Her oral temperature is 97.7 F (36.5 C). Her blood pressure is 131/77 and her pulse is 65. Her respiration is 18 and oxygen saturation is 99%.   General: Alert and oriented, in no acute distress HEENT: Head is normocephalic. Extraocular movements are intact. Oropharynx and oral cavity is clear. Neck: Neck is supple, no palpable cervical or supraclavicular lymphadenopathy. Heart: Regular in rate and rhythm with no murmurs, rubs, or gallops. Chest: Clear to auscultation bilaterally, with no rhonchi, wheezes, or rales. Abdomen: Soft, nontender, nondistended, with no rigidity or guarding. Extremities: No cyanosis or edema. No lymphedema in her bilateral arms or ankles.   Lymphatics: see Neck Exam Skin: No concerning lesions. subcutaneous rounded lumps that feel benign in the upper extremities.  Musculoskeletal: symmetric strength and muscle tone throughout. Neurologic: Cranial nerves II through XII are grossly intact. No obvious focalities. Speech is fluent. Coordination is intact. Psychiatric: Judgment and  insight are intact. Affect is appropriate. Breasts: Bilateral nipple inversion. No palpable masses appreciated in the breasts or axillae.   ECOG = 0  0 - Asymptomatic (Fully active, able to carry on all predisease activities without restriction)  1 - Symptomatic but completely ambulatory (Restricted in physically strenuous activity but ambulatory and able to carry out work of a light or sedentary nature. For example, light housework, office work)  2 - Symptomatic, <50% in bed during the day (Ambulatory and capable of all self care but unable to carry out any work activities. Up and about more than 50% of waking hours)  3 - Symptomatic, >50% in bed, but not bedbound (Capable of only limited self-care, confined to bed or chair 50% or more of waking hours)  4 - Bedbound (Completely disabled. Cannot carry on any self-care. Totally confined to bed or chair)  5 - Death   Eustace Pen MM, Creech RH, Tormey DC, et al. [phone]). "Toxicity and response criteria of the [facility]". [place] Oncol. 5 (6): 649-55   LABORATORY DATA:  Lab Results  Component Value Date   WBC 8.5 05/15/2018   HGB 14.4 05/15/2018   HCT 43.3 05/15/2018  MCV 91.4 05/15/2018   PLT 272 05/15/2018   CMP     Component Value Date/Time   NA 144 05/15/2018 1535   NA 142 04/17/2012   K 4.0 05/15/2018 1535   CL 107 05/15/2018 1535   CO2 28 05/15/2018 1535   GLUCOSE 110 (H) 05/15/2018 1535   BUN 21 05/15/2018 1535   BUN 19 04/17/2012   CREATININE 0.90 05/15/2018 1535   CREATININE 0.71 09/08/2016 1117   CALCIUM 9.7 05/15/2018 1535   PROT 7.1 05/15/2018 1535   ALBUMIN 3.8 05/15/2018 1535   AST 16 05/15/2018 1535   ALT 15 05/15/2018 1535   ALKPHOS 82 05/15/2018 1535   BILITOT 0.5 05/15/2018 1535   GFRNONAA >60 05/15/2018 1535   GFRAA >60 05/15/2018 1535         RADIOGRAPHY: Mr Breast Bilateral W Wo Contrast Inc Cad  Result Date: 05/20/2018 CLINICAL DATA:  Recent stereotactic guided core  biopsy of calcifications in the UPPER-OUTER QUADRANT of the LEFT breast shows invasive mammary carcinoma and mammary carcinoma in situ with calcifications. Based on positive E-cadherin stains, tumor is favored to be ductal. History of excisional biopsy of the RIGHT breast. LABS:  Creatinine was obtained on site at [facility] at [address] Results: Creatinine 0.9 mg/dL.  BUN is 24.  GFR is 62. EXAM: BILATERAL BREAST MRI WITH AND WITHOUT CONTRAST TECHNIQUE: Multiplanar, multisequence MR images of both breasts were obtained prior to and following the intravenous administration of 10 ml of Gadavist Three-dimensional MR images were rendered by post-processing of the original MR data on an independent workstation. The three-dimensional MR images were interpreted, and findings are reported in the following complete MRI report for this study. Three dimensional images were evaluated at the independent DynaCad workstation COMPARISON:  Previous exam(s). FINDINGS: Breast composition: b. Scattered fibroglandular tissue. Background parenchymal enhancement: Minimal Right breast: No mass or abnormal enhancement. Postoperative changes are identified in the RIGHT breast. Left breast: Within the UPPER-OUTER QUADRANT of the LEFT breast, there is segmental linear enhancement which measures 3.3 (MEDIAL to LATERAL) x 0.6 x 0.8 centimeters. The tissue marker clip adjacent to the MEDIAL aspect of the enhancement. No other areas of enhancement are identified in the LEFT breast. Lymph nodes: No abnormal appearing lymph nodes. Ancillary findings:  None. IMPRESSION: 1. Linear enhancement in the UPPER-OUTER QUADRANT of the LEFT breast corresponding to the known malignancy. 2. For localization purposes, the coil shaped clip marks the MEDIAL aspect of disease and residual calcifications in the LATERAL portion of the LEFT breast mark the LATERAL aspect of disease and can be used for localization purposes. 3. RIGHT breast is  negative. RECOMMENDATION: Bracketed seed localization of the LEFT breast at the time of lumpectomy, using the coil to mark the MEDIAL extent and the most LATERAL residual calcifications to localize the LATERAL extent of disease. BI-RADS CATEGORY  6: Known biopsy-proven malignancy. Electronically Signed   By: Nolon Nations M.D.   On: 05/20/2018 15:30   Mm Digital Diagnostic Unilat L  Result Date: 04/23/2018 CLINICAL DATA:  77 year old patient recalled from recent screening mammogram for evaluation of left breast calcifications. EXAM: DIGITAL DIAGNOSTIC LEFT MAMMOGRAM WITH CAD COMPARISON:  January 22, 2017 ACR Breast Density Category b: There are scattered areas of fibroglandular density. FINDINGS: In the middle third of the slightly upper and outer left breast is a group of heterogeneous calcifications spanning 2.5 x 2.5 x 1.4 cm. Mammographic images were processed with CAD. IMPRESSION: Grouped heterogeneous calcifications in the upper-outer left  breast. Ductal carcinoma in situ cannot be excluded. RECOMMENDATION: Left breast stereotactic biopsy is recommended and has been scheduled for Thursday April 25, 2018. I have discussed the findings and recommendations with the patient. Results were also provided in writing at the conclusion of the visit. If applicable, a reminder letter will be sent to the patient regarding the next appointment. BI-RADS CATEGORY  4: Suspicious. Electronically Signed   By: Curlene Dolphin M.D.   On: 04/23/2018 12:45   [place] Axilla Left  Result Date: 04/29/2018 CLINICAL DATA:  77 year old female recently diagnosed with left breast cancer presenting for left axillary ultrasound to evaluate lymph nodes. EXAM: ULTRASOUND OF THE LEFT AXILLA COMPARISON:  Prior exams. FINDINGS: Ultrasound of the left axilla demonstrates multiple normal-appearing lymph nodes. No suspicious masses or lymph nodes with cortical thickening are identified. IMPRESSION: No evidence of left axillary  lymphadenopathy. RECOMMENDATION: Treatment plan for known left breast cancer. I have discussed the findings and recommendations with the patient. Results were also provided in writing at the conclusion of the visit. If applicable, a reminder letter will be sent to the patient regarding the next appointment. BI-RADS CATEGORY  1: Negative. Electronically Signed   By: Ammie Ferrier M.D.   On: 04/29/2018 10:41   Mm Clip Placement Left  Result Date: 04/25/2018 CLINICAL DATA:  Post biopsy mammogram of the left breast for clip placement. EXAM: DIAGNOSTIC LEFT MAMMOGRAM POST STEREOTACTIC BIOPSY COMPARISON:  Previous exam(s). FINDINGS: Mammographic images were obtained following stereotactic guided biopsy of calcifications left breast. The coil shaped biopsy marking clip migrated about 3.5 cm medial to the site of biopsy. Spot compression magnification views over the region demonstrate numerous residual calcifications which may be used for bracketing if excision is required. IMPRESSION: The coil shaped biopsy marking clip is migrated 3.5 cm medial to the biopsy site. Residual calcifications are present for bracketing if necessary. Final Assessment: Post Procedure Mammograms for Marker Placement Electronically Signed   By: Ammie Ferrier M.D.   On: 04/25/2018 09:37   Mm Lt Breast Bx W Loc Dev 1st Lesion Image Bx Spec Stereo Guide  Addendum Date: 05/20/2018   ADDENDUM REPORT: 05/20/2018 15:52 ADDENDUM: MRI is performed 05/19/2018. Bracketed seed localization of the LEFT breast at the time of lumpectomy, using the coil to mark the MEDIAL extent and the most LATERAL residual calcifications to localize the LATERAL extent of disease. Electronically Signed   By: Nolon Nations M.D.   On: 05/20/2018 15:52   Addendum Date: 04/26/2018   ADDENDUM REPORT: 04/26/2018 14:15 ADDENDUM: Pathology revealed GRADE I INVASIVE MAMMARY CARCINOMA, MAMMARY CARCINOMA IN SITU WITH CALCIFICATIONS of the Left breast, upper outer  quadrant. This was found to be concordant by Dr. Ammie Ferrier. Pathology results were discussed with the patient by telephone. The patient reported doing well after the biopsy with tenderness at the site. Post biopsy instructions and care were reviewed and questions were answered. The patient was encouraged to call [facility] for any additional concerns. Surgical consultation has been arranged with Dr. Nedra Hai at [facility] on May 07, 2018. The patient is scheduled for a Left axillary ultrasound on April 29, 2018. Surgical consideration: The biopsy marking clip is significantly displaced. The patient will need the calcifications bracketed for localization for lumpectomy if breast conservation is desired. Pathology results reported by Terie Purser, RN on 04/26/2018. Electronically Signed   By: Ammie Ferrier M.D.   On: 04/26/2018 14:15   Result Date: 05/20/2018 CLINICAL DATA:  77 year old  female presenting for stereotactic biopsy of left breast calcifications. EXAM: LEFT BREAST STEREOTACTIC CORE NEEDLE BIOPSY COMPARISON:  Previous exams. FINDINGS: The patient and I discussed the procedure of stereotactic-guided biopsy including benefits and alternatives. We discussed the high likelihood of a successful procedure. We discussed the risks of the procedure including infection, bleeding, tissue injury, clip migration, and inadequate sampling. Informed written consent was given. The usual time out protocol was performed immediately prior to the procedure. Using sterile technique and 1% Lidocaine as local anesthetic, under stereotactic guidance, a 9 gauge vacuum assisted device was used to perform core needle biopsy of calcifications in the upper-outer quadrant of the left breast using a lateral approach. Specimen radiograph was performed showing calcifications within several core samples. Specimens with calcifications are identified for pathology. Lesion  quadrant: Upper-outer quadrant At the conclusion of the procedure, a coil shaped tissue marker clip was deployed into the biopsy cavity. Follow-up 2-view mammogram was performed and dictated separately. IMPRESSION: Stereotactic-guided biopsy of calcifications in the upper-outer left breast. No apparent complications. Electronically Signed: By: Ammie Ferrier M.D. On: 04/25/2018 09:36      IMPRESSION/PLAN:  Left breast cancer  It was a pleasure meeting the patient today. We discussed the risks, benefits, and side effects of radiotherapy. I recommend radiotherapy to the left breast to reduce her risk of locoregional recurrence by 2/3. I acknowledged her life expectancy would not likely be affected by radiotherapy if she takes antiestrogens.  If she is comfortable with a slightly higher risk of local recurrence, RT could be deferred. However I anticipate the benefits would outweigh the risks for her.  We discussed that radiation would take approximately 3.5-4 weeks to complete and that I would give the patient a few weeks to heal following surgery before starting treatment planning. We spoke about acute effects including skin irritation and fatigue as well as much less common late effects including internal organ injury or irritation. We spoke about the latest technology that is used to minimize the risk of late effects for patients undergoing radiotherapy to the breast or chest wall. No guarantees of treatment were given. The patient is thinking about proceeding with treatment. I will await her referral back to me for postoperative follow-up to discuss further.  Patient is currently exercising in the pool and voices if she would be able to continue with this during radiation treatments. I discussed with the patient that she would be able to continue with her pool exercises, however, by the end of her treatment, she may have irritiative radiation related changes to her skin. I discussed that if she notices  radiation related skin changes, that she may want to skip her pool exercises for a few weeks.     __________________________________________   Eppie Gibson, MD   This document serves as a record of services personally performed by Eppie Gibson, MD. It was created on her behalf by [facility], a trained medical scribe. The creation of this record is based on the scribe's personal observations and the provider's statements to them. This document has been checked and approved by the attending provider.

## 2018-06-11 NOTE — Pre-Procedure Instructions (Signed)
Madison Hardin  06/11/2018      Madison Hardin [facility] [place], [address] [place] [address] Phone: [phone] Fax: [phone]  [facility] [place] [facility] [address] [place] [address] Phone: [phone] Fax: [phone]    Your procedure is scheduled on Monday January 13th.  Report to [facility] Admitting at 8:15 A.M.  Call this number if you have problems the morning of surgery:  [phone]   Remember:  Do not eat or drink after midnight.    Take these medicines the morning of surgery with A SIP OF WATER  omeprazole (PRILOSEC)   7 days prior to surgery STOP taking any Aspirin(unless otherwise instructed by your surgeon), Aleve, Naproxen, Ibuprofen, Motrin, Advil, Goody's, BC's, all herbal medications, fish oil, and all vitamins    Do not wear jewelry, make-up or nail polish.  Do not wear lotions, powders, or perfumes, or deodorant.  Do not shave 48 hours prior to surgery.    Do not bring valuables to the hospital.  [facility] is not responsible for any belongings or valuables.  Contacts, eyeglasses, hearing aids, dentures or bridgework may not be worn into surgery.  Leave your suitcase in the car.  After surgery it may be brought to your room.  For patients admitted to the hospital, discharge time will be determined by your treatment team.  Patients discharged the day of surgery will not be allowed to drive home.   [name]- Preparing For Surgery  Before surgery, you can play an important role. Because skin is not sterile, your skin needs to be as free of germs as possible. You can reduce the number of germs on your skin by washing with CHG (chlorahexidine gluconate) Soap before surgery.  CHG is an antiseptic cleaner which kills germs and bonds with the skin to continue killing germs even  after washing.    Oral Hygiene is also important to reduce your risk of infection.  Remember - BRUSH YOUR TEETH THE MORNING OF SURGERY WITH YOUR REGULAR TOOTHPASTE  Please do not use if you have an allergy to CHG or antibacterial soaps. If your skin becomes reddened/irritated stop using the CHG.  Do not shave (including legs and underarms) for at least 48 hours prior to first CHG shower. It is OK to shave your face.  Please follow these instructions carefully.   1. Shower the NIGHT BEFORE SURGERY and the MORNING OF SURGERY with CHG.   2. If you chose to wash your hair, wash your hair first as usual with your normal shampoo.  3. After you shampoo, rinse your hair and body thoroughly to remove the shampoo.  4. Use CHG as you would any other liquid soap. You can apply CHG directly to the skin and wash gently with a scrungie or a clean washcloth.   5. Apply the CHG Soap to your body ONLY FROM THE NECK DOWN.  Do not use on open wounds or open sores. Avoid contact with your eyes, ears, mouth and genitals (private parts). Wash Face and genitals (private parts)  with your normal soap.  6. Wash thoroughly, paying special attention to the area where your surgery will be performed.  7. Thoroughly rinse your body with warm water from the neck down.  8. DO NOT shower/wash with your normal soap after using  and rinsing off the CHG Soap.  9. Pat yourself dry with a CLEAN TOWEL.  10. Wear CLEAN PAJAMAS to bed the night before surgery, wear comfortable clothes the morning of surgery  11. Place CLEAN SHEETS on your bed the night of your first shower and DO NOT SLEEP WITH PETS.    Day of Surgery: Shower as stated above. Do not apply any deodorants/lotions.  Please wear clean clothes to the hospital/surgery center.   Remember to brush your teeth WITH YOUR REGULAR TOOTHPASTE.   Please read over the following fact sheets that you were given.

## 2018-06-12 ENCOUNTER — Other Ambulatory Visit: Payer: Self-pay

## 2018-06-12 ENCOUNTER — Encounter (HOSPITAL_COMMUNITY): Payer: Self-pay

## 2018-06-12 ENCOUNTER — Encounter (HOSPITAL_COMMUNITY)
Admission: RE | Admit: 2018-06-12 | Discharge: 2018-06-12 | Disposition: A | Discharge status: Home / Self Care | Payer: Medicare Other | Source: Ambulatory Visit | Attending: Surgery | Admitting: Surgery

## 2018-06-12 DIAGNOSIS — Z01818 Encounter for other preprocedural examination: Secondary | ICD-10-CM | POA: Insufficient documentation

## 2018-06-12 LAB — BASIC METABOLIC PANEL
Anion gap: 12 (ref 5–15)
BUN: 19 mg/dL (ref 8–23)
CO2: 22 mmol/L (ref 22–32)
Calcium: 9.6 mg/dL (ref 8.9–10.3)
Chloride: 105 mmol/L (ref 98–111)
Creatinine, Ser: 0.84 mg/dL (ref 0.44–1.00)
GFR calc Af Amer: >60 mL/min (ref >60)
GFR calc non Af Amer: >60 mL/min (ref >60)
Glucose, Bld: 106 mg/dL — ABNORMAL HIGH (ref 70–99)
Potassium: 4 mmol/L (ref 3.5–5.1)
Sodium: 139 mmol/L (ref 135–145)

## 2018-06-12 LAB — CBC
HCT: 48.9 % — ABNORMAL HIGH (ref 36.0–46.0)
Hemoglobin: 15.4 g/dL — ABNORMAL HIGH (ref 12.0–15.0)
MCH: 29.3 pg (ref 26.0–34.0)
MCHC: 31.5 g/dL (ref 30.0–36.0)
MCV: 93.1 fL (ref 80.0–100.0)
NRBC: 0 % (ref 0.0–0.2)
Platelets: 281 10*3/uL (ref 150–400)
RBC: 5.25 MIL/uL — ABNORMAL HIGH (ref 3.87–5.11)
RDW: 13.1 % (ref 11.5–15.5)
WBC: 6.9 10*3/uL (ref 4.0–10.5)

## 2018-06-12 NOTE — Progress Notes (Signed)
PCP - Merilynn Finland Cardiologist - denies  Chest x-ray - not needed EKG - 06/12/18 Stress Test -denies  ECHO - 01/25/15 Cardiac Cath - 02/01/14  Anesthesia review: yes, cardiac history  Patient denies shortness of breath, fever, cough and chest pain at PAT appointment   Patient verbalized understanding of instructions that were given to them at the PAT appointment. Patient was also instructed that they will need to review over the PAT instructions again at home before surgery.

## 2018-06-13 NOTE — Progress Notes (Signed)
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 Seed implant 06/14/18.  History includes former smoker (quit '80), HTN, GERD, HLD, augmentation mammaplasty, left breast cancer (04/2018). S/p left cataract extraction 03/28/18 and right 03/12/18.    She denied SOB, fever, cough, and chest pain at PAT. If no acute changes then I would anticipate that she can proceed as planned.   VS: BP (!) 163/75   Pulse 89   Temp 36.6 C   Resp 20   Ht 5\' 2"  (1.575 m)   Wt 99.2 kg   SpO2 99%   BMI 39.98 kg/m    PROVIDERS: Javier Glazier, MD is PCP. Seen by Billie Ruddy, ANP on 05/20/18 to get established (see [place]). Previously she was seen by Aundria Mems, MD until 03/2017.  Jana Hakim, Sarajane Jews, MD is HEM-ONC. Eppie Gibson, MD is RAD-ONC.   LABS: Labs reviewed: Acceptable for surgery. (all labs ordered are listed, but only abnormal results are displayed)  Labs Reviewed  BASIC METABOLIC PANEL - Abnormal; Notable for the following components:      Result Value   Glucose, Bld 106 (*)    All other components within normal limits  CBC - Abnormal; Notable for the following components:   RBC 5.25 (*)    Hemoglobin 15.4 (*)    HCT 48.9 (*)    All other components within normal limits    EKG: 06/12/18: NSR.   CV: CT coronary calcium score 01/26/15 (ordered per Dr. Dianah Field per patient request): FINDINGS: Non-cardiac: See separate report from [facility]. Ascending Aorta:  Normal caliber. Pericardium: Normal Coronary arteries:  Normal origin. IMPRESSION: Coronary calcium score of 29. This was  60 percentile for age and sex matched control.  Echo 01/25/15 (ordered by Dr. Dianah Field for murmur and pre-phentermine therapy): Study Conclusions - Left ventricle: The cavity size was normal. Wall thickness was   normal. Systolic function was normal. The estimated ejection   fraction was in the range of 55% to 60%. Doppler parameters are   consistent with abnormal left ventricular relaxation (grade 1   diastolic dysfunction). - Mitral valve: There was mild regurgitation.   Past Medical History:  Diagnosis Date  . Arthritis   . Cataract 03/2018   Surgery on both eyes in 03/2018  . Family history of breast cancer   . Family history of pancreatic cancer   . Family history of prostate cancer   . GERD (gastroesophageal reflux disease)   . Hyperlipidemia   . Hypertension   . Malignant neoplasm of upper-outer quadrant of left breast in female, estrogen receptor positive ([place]) 05/15/2018  . Pneumonia    as child    Past Surgical History:  Procedure Laterality Date  . ABDOMINAL HYSTERECTOMY    . AUGMENTATION MAMMAPLASTY     Pt had explants  . BREAST EXCISIONAL BIOPSY Left   . BREAST SURGERY    . COLONOSCOPY     last 2010 in [place]  . EYE SURGERY     cataracts  . KIDNEY STONE SURGERY     flushed per pt.  Marland Kitchen  POLYPECTOMY     last 2010  . TONSILLECTOMY    . UPPER GASTROINTESTINAL ENDOSCOPY    . UTERINE FIBROID SURGERY  1980    MEDICATIONS: . Cholecalciferol (VITAMIN D) 125 MCG (5000 UT) CAPS  . MAGNESIUM PO  . Multiple Vitamins-Minerals (ICAPS AREDS 2 PO)  . omeprazole (PRILOSEC) 10 MG capsule  . telmisartan (MICARDIS) 40 MG tablet   . 0.9 %  sodium chloride infusion    Myra Gianotti, PA-C Surgical Short Stay/Anesthesiology [facility] Phone [phone] [facility] Phone [phone] 06/13/2018 11:36 AM

## 2018-06-14 ENCOUNTER — Ambulatory Visit
Admission: RE | Admit: 2018-06-14 | Discharge: 2018-06-14 | Disposition: A | Discharge status: Home / Self Care | Payer: Medicare Other | Source: Ambulatory Visit | Attending: Surgery | Admitting: Surgery

## 2018-06-14 DIAGNOSIS — Z853 Personal history of malignant neoplasm of breast: Secondary | ICD-10-CM

## 2018-06-14 DIAGNOSIS — R921 Mammographic calcification found on diagnostic imaging of breast: Secondary | ICD-10-CM | POA: Diagnosis not present

## 2018-06-16 NOTE — H&P (Signed)
Madison Hardin  Location: [facility] Patient #: [id number] DOB: [birth year] Single / Language: Cleophus Molt / Race: White Female   History of Present Illness  The patient is a 79 year old female who presents with breast cancer. This is a 78 year old female referred by Dr. Ammie Ferrier after the recent diagnosis of a left breast cancer. She is found to have calcifications in the upper outer quadrant of the left breast on screening mammography. 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 The area of calcifications measured approximately 2.5 x 2.5 cm. She denies nipple discharge. She's had a previous benign lumpectomy 40 years ago. Her mother died of breast cancer in her 31s.   Past Surgical History  Breast Augmentation  Bilateral. Cataract Surgery  Bilateral. Colon Polyp Removal - Colonoscopy  Tonsillectomy   Diagnostic Studies History  Colonoscopy  within last year Pap Smear  >5 years ago  Allergies  No Known Drug Allergies   Medication History  Telmisartan (40MG Tablet, Oral) Active. Omeprazole (10MG Capsule DR, Oral) Active. Medications Reconciled  Pregnancy / Birth History Age at menarche  30 years. Age of menopause  <46 Contraceptive History  Contraceptive implant, Oral contraceptives. Gravida  1 Maternal age  66-35 Para  0  Other Problems  Arthritis  Breast Cancer  Gastroesophageal Reflux Disease  High blood pressure  Hypercholesterolemia  Kidney Stone  Oophorectomy     Review of Systems  General Not Present- Appetite Loss, Chills, Fatigue, Fever, Night Sweats, Weight Gain and Weight Loss. Skin Not Present- Change in Wart/Mole, Dryness, Hives, Jaundice, New Lesions, Non-Healing Wounds, Rash and Ulcer. HEENT Present- Wears glasses/contact lenses. Not Present- Earache, Hearing Loss, Hoarseness, Nose Bleed, Oral Ulcers, Ringing in the Ears, Seasonal Allergies, Sinus Pain, Sore Throat,  Visual Disturbances and Yellow Eyes. Gastrointestinal Not Present- Abdominal Pain, Bloating, Bloody Stool, Change in Bowel Habits, Chronic diarrhea, Constipation, Difficulty Swallowing, Excessive gas, Gets full quickly at meals, Hemorrhoids, Indigestion, Nausea, Rectal Pain and Vomiting. Female Genitourinary Not Present- Frequency, Nocturia, Painful Urination, Pelvic Pain and Urgency. Musculoskeletal Present- Joint Pain. Not Present- Back Pain, Joint Stiffness, Muscle Pain, Muscle Weakness and Swelling of Extremities. Neurological Not Present- Decreased Memory, Fainting, Headaches, Numbness, Seizures, Tingling, Tremor, Trouble walking and Weakness. Psychiatric Not Present- Anxiety, Bipolar, Change in Sleep Pattern, Depression, Fearful and Frequent crying. Endocrine Not Present- Cold Intolerance, Excessive Hunger, Hair Changes, Heat Intolerance, Hot flashes and New Diabetes. Hematology Not Present- Blood Thinners, Easy Bruising, Excessive bleeding, Gland problems, HIV and Persistent Infections.  Vitals  Weight: 226.8 lb Height: 62in Body Surface Area: 2.02 m Body Mass Index: 41.48 kg/m  Temp.: 98.38F(Oral)  Pulse: 110 (Regular)  BP: 150/70 (Sitting, Left Arm, Standard)    Physical Exam  General Mental Status-Alert. General Appearance-Consistent with stated age. Hydration-Well hydrated. Voice-Normal.  Head and Neck Head-normocephalic, atraumatic with no lesions or palpable masses. Trachea-midline. Thyroid Gland Characteristics - normal size and consistency.  Eye Eyeball - Bilateral-Extraocular movements intact. Sclera/Conjunctiva - Bilateral-No scleral icterus.  Chest and Lung Exam Chest and lung exam reveals -quiet, even and easy respiratory effort with no use of accessory muscles and on auscultation, normal breath sounds, no adventitious sounds and normal vocal resonance. Inspection Chest Wall - Normal. Back - normal.  Breast Breast -  Left-Symmetric, Non Tender, No Biopsy scars, no Dimpling, No Inflammation, No Lumpectomy scars, No Mastectomy scars, No Peau d' Orange. Note: There is nodularity in the upper outer quadrant left breast at the site of the biopsy  but no large discrete mass. She has inverted nipples bilaterally. Breast - Right-Symmetric, Non Tender, No Biopsy scars, no Dimpling, No Inflammation, No Lumpectomy scars, No Mastectomy scars, No Peau d' Orange.  Cardiovascular Cardiovascular examination reveals -normal heart sounds, regular rate and rhythm with no murmurs and normal pedal pulses bilaterally.  Abdomen Inspection Inspection of the abdomen reveals - No Hernias. Skin - Scar - no surgical scars. Palpation/Percussion Palpation and Percussion of the abdomen reveal - Soft, Non Tender, No Rebound tenderness, No Rigidity (guarding) and No hepatosplenomegaly. Auscultation Auscultation of the abdomen reveals - Bowel sounds normal.  Neurologic - Did not examine.  Musculoskeletal Normal Exam - Left-Upper Extremity Strength Normal and Lower Extremity Strength Normal. Normal Exam - Right-Upper Extremity Strength Normal and Lower Extremity Strength Normal.  Lymphatic Head & Neck  General Head & Neck Lymphatics: Bilateral - Description - Normal. Axillary  General Axillary Region: Bilateral - Description - Normal. Tenderness - Non Tender. Femoral & Inguinal - Did not examine.    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 I discussed the diagnosis with the patient in detail. She is 100% ER and PR positive, HER-2 negative. We discussed both breast conservation as well as mastectomy. She would like to proceed with breast conservation. Given the extent of the calcifications and her family history of breast cancer, she needs a preoperative MRI to see if she is a candidate for breast conservation.  I discussed the reasons for this with her. We will also refer her to both medical and radiation oncology for evaluation as well. She may also need to see genetics counseling. I will call her back with the results of the MRI and we will discuss surgical planning further.  Addendum:  After meeting the consultants, genetics evaluation, and MRI, the plan is to proceed with a radioactive seed guided left breast partial mastectomy and sentinel node biopsy.  We again discussed the risks of surgery.  These include but are not limited to bleeding, infection, injury to surrounding structures, seroma formation, the need for further surgery is margins or nodes are positive, cardiopulmonary issues, etc.

## 2018-06-17 ENCOUNTER — Other Ambulatory Visit: Payer: Self-pay

## 2018-06-17 ENCOUNTER — Encounter (HOSPITAL_COMMUNITY)
Admission: RE | Disposition: A | Discharge status: Home / Self Care | Payer: Self-pay | Source: Home / Self Care | Attending: Surgery

## 2018-06-17 ENCOUNTER — Ambulatory Visit (HOSPITAL_COMMUNITY): Payer: Medicare Other | Admitting: Anesthesiology

## 2018-06-17 ENCOUNTER — Ambulatory Visit (HOSPITAL_COMMUNITY)
Admission: RE | Admit: 2018-06-17 | Discharge: 2018-06-17 | Disposition: A | Discharge status: Home / Self Care | Payer: Medicare Other | Source: Ambulatory Visit | Attending: Surgery | Admitting: Surgery

## 2018-06-17 ENCOUNTER — Ambulatory Visit (HOSPITAL_COMMUNITY)
Admission: RE | Admit: 2018-06-17 | Discharge: 2018-06-17 | Disposition: A | Discharge status: Home / Self Care | Payer: Medicare Other | Attending: Surgery | Admitting: Surgery

## 2018-06-17 ENCOUNTER — Ambulatory Visit
Admission: RE | Admit: 2018-06-17 | Discharge: 2018-06-17 | Disposition: A | Discharge status: Home / Self Care | Payer: Medicare Other | Source: Ambulatory Visit | Attending: Surgery | Admitting: Surgery

## 2018-06-17 ENCOUNTER — Ambulatory Visit (HOSPITAL_COMMUNITY): Payer: Medicare Other | Admitting: Vascular Surgery

## 2018-06-17 ENCOUNTER — Encounter (HOSPITAL_COMMUNITY): Payer: Self-pay | Admitting: Surgery

## 2018-06-17 DIAGNOSIS — C50912 Malignant neoplasm of unspecified site of left female breast: Secondary | ICD-10-CM | POA: Diagnosis not present

## 2018-06-17 DIAGNOSIS — K219 Gastro-esophageal reflux disease without esophagitis: Secondary | ICD-10-CM | POA: Insufficient documentation

## 2018-06-17 DIAGNOSIS — Z6837 Body mass index (BMI) 37.0-37.9, adult: Secondary | ICD-10-CM | POA: Insufficient documentation

## 2018-06-17 DIAGNOSIS — Z79899 Other long term (current) drug therapy: Secondary | ICD-10-CM | POA: Insufficient documentation

## 2018-06-17 DIAGNOSIS — E7800 Pure hypercholesterolemia, unspecified: Secondary | ICD-10-CM | POA: Insufficient documentation

## 2018-06-17 DIAGNOSIS — Z803 Family history of malignant neoplasm of breast: Secondary | ICD-10-CM | POA: Diagnosis not present

## 2018-06-17 DIAGNOSIS — M1990 Unspecified osteoarthritis, unspecified site: Secondary | ICD-10-CM | POA: Insufficient documentation

## 2018-06-17 DIAGNOSIS — Z853 Personal history of malignant neoplasm of breast: Secondary | ICD-10-CM

## 2018-06-17 DIAGNOSIS — Z170 Estrogen receptor positive status [ER+]: Secondary | ICD-10-CM | POA: Diagnosis not present

## 2018-06-17 DIAGNOSIS — G8918 Other acute postprocedural pain: Secondary | ICD-10-CM | POA: Diagnosis not present

## 2018-06-17 DIAGNOSIS — C50412 Malignant neoplasm of upper-outer quadrant of left female breast: Secondary | ICD-10-CM | POA: Diagnosis not present

## 2018-06-17 DIAGNOSIS — I10 Essential (primary) hypertension: Secondary | ICD-10-CM | POA: Diagnosis not present

## 2018-06-17 DIAGNOSIS — R921 Mammographic calcification found on diagnostic imaging of breast: Secondary | ICD-10-CM | POA: Diagnosis not present

## 2018-06-17 HISTORY — PX: BREAST LUMPECTOMY WITH RADIOACTIVE SEED AND SENTINEL LYMPH NODE BIOPSY: SHX6550

## 2018-06-17 SURGERY — BREAST LUMPECTOMY WITH RADIOACTIVE SEED AND SENTINEL LYMPH NODE BIOPSY
Anesthesia: General | Site: Breast | Laterality: Left

## 2018-06-17 MED ORDER — FENTANYL CITRATE (PF) 250 MCG/5ML IJ SOLN
INTRAMUSCULAR | Status: DC | PRN
  Administered 2018-06-17 (×2): 25 ug via INTRAVENOUS

## 2018-06-17 MED ORDER — PHENYLEPHRINE 40 MCG/ML (10ML) SYRINGE FOR IV PUSH (FOR BLOOD PRESSURE SUPPORT)
PREFILLED_SYRINGE | INTRAVENOUS | Status: DC | PRN
  Administered 2018-06-17: 80 ug via INTRAVENOUS

## 2018-06-17 MED ORDER — PROPOFOL 10 MG/ML IV BOLUS
INTRAVENOUS | Status: AC
  Filled 2018-06-17: qty 20

## 2018-06-17 MED ORDER — LIDOCAINE 2% (20 MG/ML) 5 ML SYRINGE
INTRAMUSCULAR | Status: AC
  Filled 2018-06-17: qty 5

## 2018-06-17 MED ORDER — LACTATED RINGERS IV SOLN
INTRAVENOUS | Status: DC | PRN
  Administered 2018-06-17 (×2): via INTRAVENOUS

## 2018-06-17 MED ORDER — BUPIVACAINE-EPINEPHRINE (PF) 0.25% -1:200000 IJ SOLN
INTRAMUSCULAR | Status: AC
  Filled 2018-06-17: qty 30

## 2018-06-17 MED ORDER — LACTATED RINGERS IV SOLN
INTRAVENOUS | Status: DC
  Administered 2018-06-17: 09:00:00 via INTRAVENOUS

## 2018-06-17 MED ORDER — ACETAMINOPHEN 500 MG PO TABS
ORAL_TABLET | ORAL | Status: AC
  Administered 2018-06-17: 1000 mg via ORAL
  Filled 2018-06-17: qty 2

## 2018-06-17 MED ORDER — CHLORHEXIDINE GLUCONATE CLOTH 2 % EX PADS: 6.0000 | MEDICATED_PAD | Freq: Once | CUTANEOUS | Status: DC

## 2018-06-17 MED ORDER — SCOPOLAMINE 1 MG/3DAYS TD PT72
1.0000 | MEDICATED_PATCH | Freq: Once | TRANSDERMAL | Status: DC
  Administered 2018-06-17: 1.5 mg via TRANSDERMAL

## 2018-06-17 MED ORDER — ONDANSETRON HCL 4 MG/2ML IJ SOLN
INTRAMUSCULAR | Status: DC | PRN
  Administered 2018-06-17: 4 mg via INTRAVENOUS

## 2018-06-17 MED ORDER — FENTANYL CITRATE (PF) 100 MCG/2ML IJ SOLN
100.0000 ug | Freq: Once | INTRAMUSCULAR | Status: AC
  Administered 2018-06-17: 100 ug via INTRAVENOUS

## 2018-06-17 MED ORDER — MIDAZOLAM HCL 2 MG/2ML IJ SOLN
INTRAMUSCULAR | Status: AC
  Administered 2018-06-17: 2 mg via INTRAVENOUS
  Filled 2018-06-17: qty 2

## 2018-06-17 MED ORDER — CEFAZOLIN SODIUM-DEXTROSE 2-4 GM/100ML-% IV SOLN
2.0000 g | INTRAVENOUS | Status: AC
  Administered 2018-06-17: 2 g via INTRAVENOUS

## 2018-06-17 MED ORDER — DEXAMETHASONE SODIUM PHOSPHATE 10 MG/ML IJ SOLN
INTRAMUSCULAR | Status: DC | PRN
  Administered 2018-06-17: 10 mg via INTRAVENOUS

## 2018-06-17 MED ORDER — TECHNETIUM TC 99M SULFUR COLLOID FILTERED
1.0000 | Freq: Once | INTRAVENOUS | Status: AC | PRN
  Administered 2018-06-17: 1 via INTRADERMAL

## 2018-06-17 MED ORDER — MIDAZOLAM HCL 2 MG/2ML IJ SOLN
2.0000 mg | Freq: Once | INTRAMUSCULAR | Status: AC
  Administered 2018-06-17: 2 mg via INTRAVENOUS

## 2018-06-17 MED ORDER — CEFAZOLIN SODIUM-DEXTROSE 2-4 GM/100ML-% IV SOLN
INTRAVENOUS | Status: AC
  Filled 2018-06-17: qty 100

## 2018-06-17 MED ORDER — GABAPENTIN 300 MG PO CAPS
ORAL_CAPSULE | ORAL | Status: AC
  Administered 2018-06-17: 300 mg via ORAL
  Filled 2018-06-17: qty 1

## 2018-06-17 MED ORDER — SCOPOLAMINE 1 MG/3DAYS TD PT72
MEDICATED_PATCH | TRANSDERMAL | Status: AC
  Filled 2018-06-17: qty 1

## 2018-06-17 MED ORDER — GABAPENTIN 300 MG PO CAPS
300.0000 mg | ORAL_CAPSULE | ORAL | Status: AC
  Administered 2018-06-17: 300 mg via ORAL

## 2018-06-17 MED ORDER — PROPOFOL 10 MG/ML IV BOLUS
INTRAVENOUS | Status: DC | PRN
  Administered 2018-06-17: 160 mg via INTRAVENOUS

## 2018-06-17 MED ORDER — DEXAMETHASONE SODIUM PHOSPHATE 10 MG/ML IJ SOLN
INTRAMUSCULAR | Status: AC
  Filled 2018-06-17: qty 1

## 2018-06-17 MED ORDER — OXYCODONE HCL 5 MG PO TABS: 5.0000 mg | ORAL_TABLET | Freq: Four times a day (QID) | ORAL | 0 refills | Status: DC | PRN

## 2018-06-17 MED ORDER — LIDOCAINE 2% (20 MG/ML) 5 ML SYRINGE
INTRAMUSCULAR | Status: DC | PRN
  Administered 2018-06-17: 50 mg via INTRAVENOUS

## 2018-06-17 MED ORDER — FENTANYL CITRATE (PF) 100 MCG/2ML IJ SOLN
INTRAMUSCULAR | Status: AC
  Administered 2018-06-17: 100 ug via INTRAVENOUS
  Filled 2018-06-17: qty 2

## 2018-06-17 MED ORDER — FENTANYL CITRATE (PF) 250 MCG/5ML IJ SOLN
INTRAMUSCULAR | Status: AC
  Filled 2018-06-17: qty 5

## 2018-06-17 MED ORDER — BUPIVACAINE-EPINEPHRINE 0.25% -1:200000 IJ SOLN
INTRAMUSCULAR | Status: DC | PRN
  Administered 2018-06-17: 11 mL

## 2018-06-17 MED ORDER — PHENYLEPHRINE 40 MCG/ML (10ML) SYRINGE FOR IV PUSH (FOR BLOOD PRESSURE SUPPORT)
PREFILLED_SYRINGE | INTRAVENOUS | Status: AC
  Filled 2018-06-17: qty 10

## 2018-06-17 MED ORDER — BUPIVACAINE-EPINEPHRINE (PF) 0.5% -1:200000 IJ SOLN
INTRAMUSCULAR | Status: DC | PRN
  Administered 2018-06-17: 30 mL via PERINEURAL

## 2018-06-17 MED ORDER — ACETAMINOPHEN 500 MG PO TABS
1000.0000 mg | ORAL_TABLET | ORAL | Status: AC
  Administered 2018-06-17: 1000 mg via ORAL

## 2018-06-17 MED ORDER — 0.9 % SODIUM CHLORIDE (POUR BTL) OPTIME
TOPICAL | Status: DC | PRN
  Administered 2018-06-17: 1000 mL

## 2018-06-17 MED ORDER — ONDANSETRON HCL 4 MG/2ML IJ SOLN
INTRAMUSCULAR | Status: AC
  Filled 2018-06-17: qty 2

## 2018-06-17 SURGICAL SUPPLY — 45 items
APPLIER CLIP 9.375 MED OPEN (MISCELLANEOUS) ×3
BINDER BREAST LRG (GAUZE/BANDAGES/DRESSINGS) IMPLANT
BINDER BREAST XLRG (GAUZE/BANDAGES/DRESSINGS) IMPLANT
BLADE SURG 15 STRL LF DISP TIS (BLADE) ×1 IMPLANT
BLADE SURG 15 STRL SS (BLADE) ×2
CANISTER SUCT 3000ML PPV (MISCELLANEOUS) ×3 IMPLANT
CHLORAPREP W/TINT 26ML (MISCELLANEOUS) ×3 IMPLANT
CLIP APPLIE 9.375 MED OPEN (MISCELLANEOUS) ×1 IMPLANT
COVER PROBE W GEL 5X96 (DRAPES) ×3 IMPLANT
COVER SURGICAL LIGHT HANDLE (MISCELLANEOUS) ×3 IMPLANT
COVER WAND RF STERILE (DRAPES) ×3 IMPLANT
DERMABOND ADVANCED (GAUZE/BANDAGES/DRESSINGS) ×2
DERMABOND ADVANCED .7 DNX12 (GAUZE/BANDAGES/DRESSINGS) ×1 IMPLANT
DEVICE DUBIN SPECIMEN MAMMOGRA (MISCELLANEOUS) ×3 IMPLANT
DRAPE CHEST BREAST 15X10 FENES (DRAPES) ×3 IMPLANT
DRAPE UTILITY XL STRL (DRAPES) ×3 IMPLANT
ELECT CAUTERY BLADE 6.4 (BLADE) ×3 IMPLANT
ELECT REM PT RETURN 9FT ADLT (ELECTROSURGICAL) ×3
ELECTRODE REM PT RTRN 9FT ADLT (ELECTROSURGICAL) ×1 IMPLANT
GAUZE SPONGE 4X4 12PLY STRL (GAUZE/BANDAGES/DRESSINGS) ×3 IMPLANT
GLOVE SURG SIGNA 7.5 PF LTX (GLOVE) ×3 IMPLANT
GOWN STRL REUS W/ TWL LRG LVL3 (GOWN DISPOSABLE) ×1 IMPLANT
GOWN STRL REUS W/ TWL XL LVL3 (GOWN DISPOSABLE) ×1 IMPLANT
GOWN STRL REUS W/TWL LRG LVL3 (GOWN DISPOSABLE) ×2
GOWN STRL REUS W/TWL XL LVL3 (GOWN DISPOSABLE) ×2
KIT BASIN OR (CUSTOM PROCEDURE TRAY) ×3 IMPLANT
KIT MARKER MARGIN INK (KITS) ×3 IMPLANT
NDL SAFETY ECLIPSE 18X1.5 (NEEDLE) IMPLANT
NEEDLE FILTER BLUNT 18X 1/2SAF (NEEDLE)
NEEDLE FILTER BLUNT 18X1 1/2 (NEEDLE) IMPLANT
NEEDLE HYPO 18GX1.5 SHARP (NEEDLE)
NEEDLE HYPO 25GX1X1/2 BEV (NEEDLE) ×3 IMPLANT
NS IRRIG 1000ML POUR BTL (IV SOLUTION) ×3 IMPLANT
PACK SURGICAL SETUP 50X90 (CUSTOM PROCEDURE TRAY) ×3 IMPLANT
PENCIL BUTTON HOLSTER BLD 10FT (ELECTRODE) ×3 IMPLANT
SPONGE LAP 18X18 X RAY DECT (DISPOSABLE) ×3 IMPLANT
SUT MNCRL AB 4-0 PS2 18 (SUTURE) ×3 IMPLANT
SUT VIC AB 3-0 SH 18 (SUTURE) ×3 IMPLANT
SYR BULB 3OZ (MISCELLANEOUS) ×3 IMPLANT
SYR CONTROL 10ML LL (SYRINGE) ×3 IMPLANT
TOWEL OR 17X24 6PK STRL BLUE (TOWEL DISPOSABLE) ×3 IMPLANT
TOWEL OR 17X26 10 PK STRL BLUE (TOWEL DISPOSABLE) ×3 IMPLANT
TUBE CONNECTING 12'X1/4 (SUCTIONS) ×1
TUBE CONNECTING 12X1/4 (SUCTIONS) ×2 IMPLANT
YANKAUER SUCT BULB TIP NO VENT (SUCTIONS) ×3 IMPLANT

## 2018-06-17 NOTE — Anesthesia Preprocedure Evaluation (Addendum)
Anesthesia Evaluation  Patient identified by MRN, date of birth, ID band Patient awake    Reviewed: Allergy & Precautions, NPO status , Patient's Chart, lab work & pertinent test results  Airway Mallampati: II  TM Distance: >3 FB Neck ROM: Full    Dental no notable dental hx. (+) Dental Advisory Given, Teeth Intact, Chipped,    Pulmonary neg pulmonary ROS, former smoker,    Pulmonary exam normal        Cardiovascular hypertension, Pt. on medications Normal cardiovascular exam     Neuro/Psych PSYCHIATRIC DISORDERS Depression negative neurological ROS     GI/Hepatic Neg liver ROS, GERD  Medicated,  Endo/Other  Morbid obesity  Renal/GU negative Renal ROS  negative genitourinary   Musculoskeletal negative musculoskeletal ROS (+)   Abdominal   Peds negative pediatric ROS (+)  Hematology negative hematology ROS (+)   Anesthesia Other Findings   Reproductive/Obstetrics negative OB ROS                            Anesthesia Physical Anesthesia Plan  ASA: III  Anesthesia Plan: General   Post-op Pain Management:    Induction: Intravenous  PONV Risk Score and Plan: Ondansetron, Propofol infusion, Scopolamine patch - Pre-op and Diphenhydramine  Airway Management Planned: LMA  Additional Equipment:   Intra-op Plan:   Post-operative Plan: Extubation in OR  Informed Consent: I have reviewed the patients History and Physical, chart, labs and discussed the procedure including the risks, benefits and alternatives for the proposed anesthesia with the patient or authorized representative who has indicated his/her understanding and acceptance.   Dental advisory given  Plan Discussed with: CRNA and Anesthesiologist  Anesthesia Plan Comments:         Anesthesia Quick Evaluation

## 2018-06-17 NOTE — Transfer of Care (Signed)
Immediate Anesthesia Transfer of Care Note  Patient: Madison Hardin  Procedure(s) Performed: LEFT BREAST PARTIAL MASTECTOMY WITH RADIOACTIVE SEED AND SENTINEL LYMPH NODE BIOPSY (Left Breast)  Patient Location: PACU  Anesthesia Type:General and Regional  Level of Consciousness: awake and alert   Airway & Oxygen Therapy: Patient Spontanous Breathing and Patient connected to face mask oxygen  Post-op Assessment: Report given to RN and Post -op Vital signs reviewed and stable  Post vital signs: Reviewed and stable  Last Vitals:  Vitals Value Taken Time  BP 138/45 06/17/2018 11:23 AM  Temp    Pulse 85 06/17/2018 11:34 AM  Resp 16 06/17/2018 11:34 AM  SpO2 99 % 06/17/2018 11:34 AM  Vitals shown include unvalidated device data.  Last Pain:  Vitals:   06/17/18 0830  TempSrc:   PainSc: 1       Patients Stated Pain Goal: 0 (89/84/21 0312)  Complications: No apparent anesthesia complications

## 2018-06-17 NOTE — Op Note (Signed)
LEFT BREAST PARTIAL MASTECTOMY WITH RADIOACTIVE SEED AND DEEP LEFT AXILLARY SENTINEL LYMPH NODE BIOPSY  Procedure Note  Madison Hardin 06/17/2018   Pre-op Diagnosis: LEFT BREAST CANCER      Post-op Diagnosis: same  Procedure(s): LEFT BREAST PARTIAL MASTECTOMY WITH RADIOACTIVE SEED AND DEEP LEFT AXILLARY SENTINEL LYMPH NODE BIOPSY  Surgeon(s): Coralie Keens, MD  Anesthesia: General  Staff:  Circulator: Rozell Searing, RN; Hitchcock, Delight Stare, RN Scrub Person: Gifford Shave, RN  Estimated Blood Loss: Minimal               Specimens: sent to path  Indications: This is a 78 year old female with a greater than 3 cm area of both invasive and ductal carcinoma in situ of the left breast.  After long discussion, she wishes to proceed with a bracketed left breast partial mastectomy and deep axillary sentinel lymph node biopsy  Procedure: The patient was brought to the operating room and identified as correct patient.  She is placed upon the operating table general anesthesia was induced.  Her left breast and axilla were then prepped and draped in usual sterile fashion.  Using neoprobe identified the 2 separate radioactive seeds in the left breast in the upper outer quadrant.  I anesthetized skin between the 2 with Marcaine and made an incision with a scalpel in the upper outer quadrant of the breast.  I then dissected down to the breast tissue with electrocautery.  I then performed a partial mastectomy including the entire left upper outer quadrant of the breast.  I used the neoprobe to stay widely around both seeds.  I took the dissection all the way down to the chest wall.  The large specimen was then completely removed with cautery.  I marked all the margins with paint.  We x-rayed the specimen and both radioactive seeds in the previous marker and calcifications were in the specimen.  This was sent to pathology for evaluation.  I was so close the axilla I decided to dissect into  the axillary lymph nodes through this breast incision.  With the neoprobe I was able to identify several axillary lymph nodes in the deep axillary tissue.  I excised these nodes with the cautery and sent the pathology for evaluation.  I did clip 1 bridging vessel with surgical clips.  I then anesthetized all wounds further with Marcaine.  I irrigated the wound with saline.  Hemostasis to be achieved.  I then left surgical clips at the cavity of the partial mastectomy for marker purposes as well.  I then closed the subcutaneous tissue with interrupted 3-0 Vicryl sutures and closed the skin with a running 4-0 Monocryl.  Dermabond was then applied.  The patient tolerated procedure well.  All the counts were correct at the end of the procedure.  The patient was then extubated in the operating room and taken in a stable condition to the recovery room.          Coralie Keens   Date: 06/17/2018  Time: 11:18 AM

## 2018-06-17 NOTE — Anesthesia Procedure Notes (Signed)
Anesthesia Regional Block: Pectoralis block   Pre-Anesthetic Checklist: ,, timeout performed, Correct Patient, Correct Site, Correct Laterality, Correct Procedure, Correct Position, site marked, Risks and benefits discussed,  Surgical consent,  Pre-op evaluation,  At surgeon's request and post-op pain management  Laterality: Left  Prep: chloraprep       Needles:  Injection technique: Single-shot  Needle Type: Echogenic Stimulator Needle     Needle Length: 10cm  Needle Gauge: 21     Additional Needles:   Narrative:  Start time: 06/17/2018 9:27 AM End time: 06/17/2018 9:37 AM Injection made incrementally with aspirations every 5 mL.  Performed by: Personally

## 2018-06-17 NOTE — Discharge Instructions (Signed)
[place] Office Phone Number [phone]  BREAST BIOPSY/ PARTIAL MASTECTOMY: POST OP INSTRUCTIONS  Always review your discharge instruction sheet given to you by the facility where your surgery was performed.  IF YOU HAVE DISABILITY OR FAMILY LEAVE FORMS, YOU MUST BRING THEM TO THE OFFICE FOR PROCESSING.  DO NOT GIVE THEM TO YOUR DOCTOR.  1. A prescription for pain medication may be given to you upon discharge.  Take your pain medication as prescribed, if needed.  If narcotic pain medicine is not needed, then you may take acetaminophen (Tylenol) or ibuprofen (Advil) as needed. 2. Take your usually prescribed medications unless otherwise directed 3. If you need a refill on your pain medication, please contact your pharmacy.  They will contact our office to request authorization.  Prescriptions will not be filled after 5pm or on week-ends. 4. You should eat very light the first 24 hours after surgery, such as soup, crackers, pudding, etc.  Resume your normal diet the day after surgery. 5. Most patients will experience some swelling and bruising in the breast.  Ice packs and a good support bra will help.  Swelling and bruising can take several days to resolve.  6. It is common to experience some constipation if taking pain medication after surgery.  Increasing fluid intake and taking a stool softener will usually help or prevent this problem from occurring.  A mild laxative (Milk of Magnesia or Miralax) should be taken according to package directions if there are no bowel movements after 48 hours. 7. Unless discharge instructions indicate otherwise, you may remove your bandages 24-48 hours after surgery, and you may shower at that time.  You may have steri-strips (small skin tapes) in place directly over the incision.  These strips should be left on the skin for 7-10 days.  If your surgeon used skin glue on the incision, you may shower in 24 hours.  The glue will flake off over the  next 2-3 weeks.  Any sutures or staples will be removed at the office during your follow-up visit. 8. ACTIVITIES:  You may resume regular daily activities (gradually increasing) beginning the next day.  Wearing a good support bra or sports bra minimizes pain and swelling.  You may have sexual intercourse when it is comfortable. a. You may drive when you no longer are taking prescription pain medication, you can comfortably wear a seatbelt, and you can safely maneuver your car and apply brakes. b. RETURN TO WORK:  ______________________________________________________________________________________ 9. You should see your doctor in the office for a follow-up appointment approximately two weeks after your surgery.  Your doctors nurse will typically make your follow-up appointment when she calls you with your pathology report.  Expect your pathology report 2-3 business days after your surgery.  You may call to check if you do not hear from [place] after three days. 10. OTHER INSTRUCTIONS:YOU MAY REMOVE THE BINDER TOMORROW MORNING.  YOU ONLY NEED TO WEAR IT FOR COMFORT 11. OK TO SHOWER STARTING TOMORROW 12. ICE PACK, TYLENOL, IBUPROFEN ALSO FOR PAIN _______________________________________________________________________________________________ _____________________________________________________________________________________________________________________________________ _____________________________________________________________________________________________________________________________________ _____________________________________________________________________________________________________________________________________  WHEN TO CALL YOUR DOCTOR: 1. Fever over 101.0 2. Nausea and/or vomiting. 3. Extreme swelling or bruising. 4. Continued bleeding from incision. 5. Increased pain, redness, or drainage from the incision.  The clinic staff is available to answer your questions during regular  business hours.  Please dont hesitate to call and ask to speak to one of the nurses for clinical concerns.  If you have a medical emergency, go to  the nearest emergency room or call 911.  A surgeon from [facility] is always on call at the hospital.  For further questions, please visit [email or web address]

## 2018-06-17 NOTE — Anesthesia Postprocedure Evaluation (Signed)
Anesthesia Post Note  Patient: Madison Hardin  Procedure(s) Performed: LEFT BREAST PARTIAL MASTECTOMY WITH RADIOACTIVE SEED AND SENTINEL LYMPH NODE BIOPSY (Left Breast)     Patient location during evaluation: PACU Anesthesia Type: General Level of consciousness: awake and alert Pain management: pain level controlled Vital Signs Assessment: post-procedure vital signs reviewed and stable Respiratory status: spontaneous breathing, nonlabored ventilation, respiratory function stable and patient connected to nasal cannula oxygen Cardiovascular status: blood pressure returned to baseline and stable Postop Assessment: no apparent nausea or vomiting Anesthetic complications: no    Last Vitals:  Vitals:   06/17/18 1205 06/17/18 1217  BP: (!) 141/77 127/63  Pulse: 73 67  Resp: 20 19  Temp:  36.5 C  SpO2: 96% 99%    Last Pain:  Vitals:   06/17/18 1217  TempSrc:   PainSc: 0-No pain                 Effie Berkshire

## 2018-06-17 NOTE — Interval H&P Note (Signed)
History and Physical Interval Note:no change in H and P  06/17/2018 9:52 AM  Madison Hardin  has presented today for surgery, with the diagnosis of LEFT BREAST CANCER AND DCIS  The various methods of treatment have been discussed with the patient and family. After consideration of risks, benefits and other options for treatment, the patient has consented to  Procedure(s): LEFT BREAST PARTIAL MASTECTOMY WITH RADIOACTIVE SEED AND SENTINEL LYMPH NODE BIOPSY (Left) as a surgical intervention .  The patient's history has been reviewed, patient examined, no change in status, stable for surgery.  I have reviewed the patient's chart and labs.  Questions were answered to the patient's satisfaction.     Coralie Keens

## 2018-06-17 NOTE — Anesthesia Procedure Notes (Signed)
Procedure Name: LMA Insertion Date/Time: 06/17/2018 10:33 AM Performed by: Harden Mo, CRNA Pre-anesthesia Checklist: Patient identified, Emergency Drugs available, Suction available and Patient being monitored Patient Re-evaluated:Patient Re-evaluated prior to induction Oxygen Delivery Method: Circle System Utilized Preoxygenation: Pre-oxygenation with 100% oxygen Induction Type: IV induction Ventilation: Mask ventilation without difficulty LMA: LMA inserted LMA Size: 4.0 Number of attempts: 1 Airway Equipment and Method: Bite block Placement Confirmation: positive ETCO2 Tube secured with: Tape Dental Injury: Teeth and Oropharynx as per pre-operative assessment

## 2018-06-18 ENCOUNTER — Encounter (HOSPITAL_COMMUNITY): Payer: Self-pay | Admitting: Surgery

## 2018-06-20 ENCOUNTER — Other Ambulatory Visit: Payer: Self-pay | Admitting: *Deleted

## 2018-06-20 DIAGNOSIS — Z170 Estrogen receptor positive status [ER+]: Principal | ICD-10-CM

## 2018-06-20 DIAGNOSIS — C50412 Malignant neoplasm of upper-outer quadrant of left female breast: Secondary | ICD-10-CM

## 2018-06-28 NOTE — Progress Notes (Signed)
[place]  [phone] [phone]     ID: Madison Hardin DOB: 1940-10-04  MR#: [id number]  TDV#:[id number]  Patient Care Team: Javier Glazier, MD as PCP - General (Internal Medicine) Coralie Keens, MD as Consulting Physician (General Surgery) Eppie Gibson, MD as Attending Physician (Radiation Oncology) Magrinat, Virgie Dad, MD as Consulting Physician (Oncology) Pyrtle, Lajuan Lines, MD as Consulting Physician (Gastroenterology) OTHER MD:   CHIEF COMPLAINT: Estrogen receptor positive breast cancer   CURRENT TREATMENT: Anastrozole; considering adjuvant radiation   HISTORY OF CURRENT ILLNESS:  From the original intake note:  Madison Hardin had routine screening mammography on 04/10/2018 showing a possible abnormality in the left breast. She underwent unilateral left diagnostic mammography with tomography and left breast ultrasonography at [facility] on 04/23/2018 showing: Breast Density Category B. In the middle third of the slightly upper and outer left breast is a group of heterogeneous calcifications spanning 2.5 x 2.5 x 1.4 cm on mammography. Ultrasound of the left axilla demonstrates multiple normal-appearing lymph nodes. No suspicious masses or lymph nodes with cortical thickening are identified by ultrasonography.   Accordingly on 04/25/2018 she proceeded to biopsy of the left breast area in question. The pathology from this procedure showed [id number]): Invasive mammary carcinoma, e-cadherin positive, grade 1 Prognostic indicators significant for: estrogen receptor, 100% positive and progesterone receptor, 100% positive, both with strong staining intensity. Proliferation marker Ki67 at 3%. HER2 negative immunohistochemical and morphometric analysis (1+).   The patient's subsequent history is as detailed below.   INTERVAL HISTORY: Madison Hardin returns today for follow-up and treatment of her estrogen receptor positive breast cancer.    Genetic testing performed through [facility]'s Breast Cancer STAT + Common Hereditary Cancers Panel on 05/09/2018 showed no pathogenic mutations. The STAT Breast cancer panel offered by [facility] includes sequencing and rearrangement analysis for the following 9 genes:  ATM, BRCA1, BRCA2, CDH1, CHEK2, PALB2, PTEN, STK11 and TP53.  The Common Hereditary Cancers Panel offered by [facility] includes sequencing and/or deletion duplication testing of the following 47 genes: APC, ATM, AXIN2, BARD1, BMPR1A, BRCA1, BRCA2, BRIP1, CDH1, CDKN2A (p14ARF), CDKN2A (p16INK4a), CKD4, CHEK2, CTNNA1, DICER1, EPCAM (Deletion/duplication testing only), GREM1 (promoter region deletion/duplication testing only), KIT, MEN1, MLH1, MSH2, MSH3, MSH6, MUTYH, NBN, NF1, NHTL1, PALB2, PDGFRA, PMS2, POLD1, POLE, PTEN, RAD50, RAD51C, RAD51D, SDHB, SDHC, SDHD, SMAD4, SMARCA4. STK11, TP53, TSC1, TSC2, and VHL.  The following genes were evaluated for sequence changes only: SDHA and HOXB13 c.251G>A variant only.  (a) A variant of uncertain significance (VUS) in a gene called APC was also noted.  She underwent a left breast partial mastectomy on 06/17/2018. The pathology from this procedure showed ([id number]):  1. 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 See note. - Ductal carcinoma in situ, intermediate nuclear grade with calcifications. - Carcinoma is 0.2 cm and DCIS is less than 0.1 cm from the posterior margin; other margins are negative for carcinoma. - Negative for lymphovascucar or perineural invasion.  - Biopsy site changes. 2. Lymph node, sentinel, biopsy, Left Axillary - Lymph node, negative for carcinoma (0/1) 3. Lymph node, sentinel, biopsy, Left Axillary  - Lymph node, negative for carcinoma (0/1)  She is scheduled for radiation to start on 07/10/2018.   REVIEW OF SYSTEMS: Madison Hardin experienced very little pain in her breast, but she had some discomfort under her arm that she described as  "1000 paper cuts." Her armpit still has some discomfort, but it has improved some. Madison Hardin loves to read  and catch up on the news. For exercise, she stretches in the pool for about 1 1/2 hours every day. She walks when she is able to. The patient denies unusual headaches, visual changes, nausea, vomiting, or dizziness. There has been no unusual cough, phlegm production, or pleurisy. This been no change in bowel or bladder habits. The patient denies unexplained fatigue or unexplained weight loss, bleeding, rash, or fever. A detailed review of systems was otherwise noncontributory.     PAST MEDICAL HISTORY: Past Medical History:  Diagnosis Date  . Arthritis   . Cataract 03/2018   Surgery on both eyes in 03/2018  . Family history of breast cancer   . Family history of pancreatic cancer   . Family history of prostate cancer   . GERD (gastroesophageal reflux disease)   . Hyperlipidemia   . Hypertension   . Malignant neoplasm of upper-outer quadrant of left breast in female, estrogen receptor positive (Madison Hardin) 05/15/2018  . Pneumonia    as child     PAST SURGICAL HISTORY: Past Surgical History:  Procedure Laterality Date  . ABDOMINAL HYSTERECTOMY    . AUGMENTATION MAMMAPLASTY     Pt had explants  . BREAST EXCISIONAL BIOPSY Left   . BREAST LUMPECTOMY WITH RADIOACTIVE SEED AND SENTINEL LYMPH NODE BIOPSY Left 06/17/2018   Procedure: LEFT BREAST PARTIAL MASTECTOMY WITH RADIOACTIVE SEED AND SENTINEL LYMPH NODE BIOPSY;  Surgeon: Coralie Keens, MD;  Location: [facility];  Service: General;  Laterality: Left;  . BREAST SURGERY    . COLONOSCOPY     last 2010 in [place]  . EYE SURGERY     cataracts  . KIDNEY STONE SURGERY     flushed per pt.  Marland Kitchen POLYPECTOMY     last 2010  . TONSILLECTOMY    . UPPER GASTROINTESTINAL ENDOSCOPY    . UTERINE FIBROID SURGERY  1980     FAMILY HISTORY Family History  Problem Relation Age of Onset  . Breast cancer Mother 29       without treatment  . Prostate  cancer Father 69  . Penile cancer Father   . Leukemia Paternal Grandmother   . Pancreatic cancer Paternal Aunt        dx late 2s  . Colon cancer Neg Hx   . Rectal cancer Neg Hx   . Stomach cancer Neg Hx   . Esophageal cancer Neg Hx   . Colon polyps Neg Hx    The patient's father had a history of prostate and penile cancer; he died at age 51. Patients' mother died from breast cancer at age 74.  Apparently she can seal the cancer for a long time before bringing it to medical attention and then refused treatment.  The patient has no siblings. Patient denies anyone in her family having ovarian cancer. The patient's paternal aunt had pancreatic cancer.   GYNECOLOGIC HISTORY:  Menarche: 78 years old Age at first live birth:  [place] P: 0 LMP: No LMP recorded. Patient has had a hysterectomy. 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 She lives alone and has no pets. She has no children. She does not attend a church/synagog/mosque.   ADVANCED DIRECTIVES: To be discussed   HEALTH MAINTENANCE: Social History   Tobacco Use  . Smoking status: Former Smoker    Last attempt to quit: 06/05/1978    Years since quitting:  40.0  . Smokeless tobacco: Never Used  Substance Use Topics  . Alcohol use: No  . Drug use: No     Colonoscopy: yes, within the last year  PAP: >5 years ago  Bone density: yes, not recent   No Known Allergies  Current Outpatient Medications  Medication Sig Dispense Refill  . anastrozole (ARIMIDEX) 1 MG tablet Take 1 tablet (1 mg total) by mouth daily. 90 tablet 4  . Cholecalciferol (VITAMIN D) 125 MCG (5000 UT) CAPS Take 5,000 Units by mouth daily.     Marland Kitchen MAGNESIUM PO Take 320 mg by mouth daily.     . Multiple Vitamins-Minerals (ICAPS AREDS 2 PO) Take 1 tablet by mouth 2 (two) times daily.     Marland Kitchen omeprazole (PRILOSEC) 10 MG  capsule Take 1 capsule (10 mg total) by mouth daily. 90 capsule 3  . oxyCODONE (OXY IR/ROXICODONE) 5 MG immediate release tablet Take 1 tablet (5 mg total) by mouth every 6 (six) hours as needed for moderate pain or severe pain. 30 tablet 0  . telmisartan (MICARDIS) 40 MG tablet Take 1 tablet (40 mg total) by mouth daily. 90 tablet 3   No current facility-administered medications for this visit.      OBJECTIVE: obese white woman who appears stated age 25:   07/01/18 1531  BP: 136/72  Pulse: 93  Resp: 18  Temp: 98.6 F (37 C)  SpO2: 98%     Body mass index is 38.17 kg/m.   Wt Readings from Last 3 Encounters:  07/01/18 215 lb 8 oz (97.8 kg)  06/17/18 214 lb (97.1 kg)  06/12/18 218 lb 9.6 oz (99.2 kg)      ECOG FS:1 - Symptomatic but completely ambulatory  Sclerae unicteric, EOMs intact Oropharynx clear and moist No cervical or supraclavicular adenopathy Lungs no rales or rhonchi Heart regular rate and rhythm Abd soft, nontender, positive bowel sounds MSK no focal spinal tenderness, no upper extremity lymphedema Neuro: nonfocal, well oriented, appropriate affect Breasts: The right breast is benign.  The left breast is status post recent lumpectomy.  There is no evidence of dehiscence, erythema, or swelling.  Both axillae are benign.  LAB RESULTS:  CMP     Component Value Date/Time   NA 139 06/12/2018 1018   NA 142 04/17/2012   K 4.0 06/12/2018 1018   CL 105 06/12/2018 1018   CO2 22 06/12/2018 1018   GLUCOSE 106 (H) 06/12/2018 1018   BUN 19 06/12/2018 1018   BUN 19 04/17/2012   CREATININE 0.84 06/12/2018 1018   CREATININE 0.90 05/15/2018 1535   CREATININE 0.71 09/08/2016 1117   CALCIUM 9.6 06/12/2018 1018   PROT 7.1 05/15/2018 1535   ALBUMIN 3.8 05/15/2018 1535   AST 16 05/15/2018 1535   ALT 15 05/15/2018 1535   ALKPHOS 82 05/15/2018 1535   BILITOT 0.5 05/15/2018 1535   GFRNONAA >60 06/12/2018 1018   GFRNONAA >60 05/15/2018 1535   GFRAA >60 06/12/2018  1018   GFRAA >60 05/15/2018 1535    No results found for: TOTALPROTELP, ALBUMINELP, A1GS, A2GS, BETS, BETA2SER, GAMS, MSPIKE, SPEI  No results found for: KPAFRELGTCHN, LAMBDASER, KAPLAMBRATIO  Lab Results  Component Value Date   WBC 6.9 06/12/2018   NEUTROABS 5.4 05/15/2018   HGB 15.4 (H) 06/12/2018   HCT 48.9 (H) 06/12/2018   MCV 93.1 06/12/2018   PLT 281 06/12/2018    '@LASTCHEMISTRY' @  No results found for: LABCA2  No components found for: TFTDDU202  No results for input(s): INR  in the last 168 hours.  No results found for: LABCA2  No results found for: QPY195  No results found for: KDT267  No results found for: TIW580  No results found for: CA2729  No components found for: HGQUANT  No results found for: CEA1 / No results found for: CEA1   No results found for: AFPTUMOR  No results found for: CHROMOGRNA  No results found for: PSA1  No visits with results within 3 Day(s) from this visit.  Latest known visit with results is:  Hospital Outpatient Visit on 06/12/2018  Component Date Value Ref Range Status  . Sodium 06/12/2018 139  135 - 145 mmol/L Final  . Potassium 06/12/2018 4.0  3.5 - 5.1 mmol/L Final  . Chloride 06/12/2018 105  98 - 111 mmol/L Final  . CO2 06/12/2018 22  22 - 32 mmol/L Final  . Glucose, Bld 06/12/2018 106* 70 - 99 mg/dL Final  . BUN 06/12/2018 19  8 - 23 mg/dL Final  . Creatinine, Ser 06/12/2018 0.84  0.44 - 1.00 mg/dL Final  . Calcium 06/12/2018 9.6  8.9 - 10.3 mg/dL Final  . GFR calc non Af Amer 06/12/2018 >60  >60 mL/min Final  . GFR calc Af Amer 06/12/2018 >60  >60 mL/min Final  . Anion gap 06/12/2018 12  5 - 15 Final   Performed at [facility], [place] [address] [place], [place] [address]  . WBC 06/12/2018 6.9  4.0 - 10.5 K/uL Final  . RBC 06/12/2018 5.25* 3.87 - 5.11 MIL/uL Final  . Hemoglobin 06/12/2018 15.4* 12.0 - 15.0 g/dL Final  . HCT 06/12/2018 48.9* 36.0 - 46.0 % Final  . MCV 06/12/2018 93.1  80.0 - 100.0 fL Final    . MCH 06/12/2018 29.3  26.0 - 34.0 pg Final  . MCHC 06/12/2018 31.5  30.0 - 36.0 g/dL Final  . RDW 06/12/2018 13.1  11.5 - 15.5 % Final  . Platelets 06/12/2018 281  150 - 400 K/uL Final  . nRBC 06/12/2018 0.0  0.0 - 0.2 % Final   Performed at [facility], [place] [address] [place], [place] [address]    (this displays the last labs from the last 3 days)  No results found for: TOTALPROTELP, ALBUMINELP, A1GS, A2GS, BETS, BETA2SER, GAMS, MSPIKE, SPEI (this displays SPEP labs)  No results found for: KPAFRELGTCHN, LAMBDASER, KAPLAMBRATIO (kappa/lambda light chains)  No results found for: HGBA, HGBA2QUANT, HGBFQUANT, HGBSQUAN (Hemoglobinopathy evaluation)   No results found for: LDH  No results found for: IRON, TIBC, IRONPCTSAT (Iron and TIBC)  No results found for: FERRITIN  Urinalysis No results found for: COLORURINE, APPEARANCEUR, LABSPEC, PHURINE, GLUCOSEU, HGBUR, BILIRUBINUR, KETONESUR, PROTEINUR, UROBILINOGEN, NITRITE, LEUKOCYTESUR   STUDIES:  Nm Sentinel Node Inj-no Rpt (breast)  Result Date: 06/17/2018 Sulfur colloid was injected by the nuclear medicine technologist for melanoma sentinel node.   Mm Breast Surgical Specimen  Result Date: 06/17/2018 CLINICAL DATA:  Patient had a bracket radioactive seed localization. Biopsy proven left breast cancer. EXAM: SPECIMEN RADIOGRAPH OF THE LEFT BREAST COMPARISON:  Previous exam(s). FINDINGS: Status post excision of the left breast. The radioactive seeds and biopsy marker clip are present, completely intact, and were marked for pathology. IMPRESSION: Specimen radiograph of the left breast. Electronically Signed   By: Lillia Mountain M.D.   On: 06/17/2018 10:59   Mm Lt Radioactive Seed Loc Mammo Guide  Result Date: 06/14/2018 CLINICAL DATA:  Patient presents for radioactive seed localization prior to surgical excision of biopsy-proven invasive mammary  carcinoma left breast. Plan to bracket the medial edge of the targeted area marked  by coil shaped post biopsy clip as well as the lateral edge of the targeted area marked by residual microcalcifications. EXAM: MAMMOGRAPHIC GUIDED RADIOACTIVE SEED LOCALIZATION OF THE LEFT BREAST COMPARISON:  Previous exam(s). FINDINGS: Patient presents for radioactive seed localization prior to surgical excision. I met with the patient and we discussed the procedure of seed localization including benefits and alternatives. We discussed the high likelihood of a successful procedure. We discussed the risks of the procedure including infection, bleeding, tissue injury and further surgery. We discussed the low dose of radioactivity involved in the procedure. Informed, written consent was given. The usual time-out protocol was performed immediately prior to the procedure. Using mammographic guidance, sterile technique, 1% lidocaine and an I-125 radioactive seed, the coil shaped biopsy clip was localized using a superior to inferior approach. The follow-up mammogram images confirm the seed in the expected location and were marked for Dr. Ninfa Linden. The seed lies immediately along the inferior medial aspect of the clip. Follow-up survey of the patient confirms presence of the radioactive seed. Order number of I-125 seed:  [id number]. Total activity:  6.073 millicurie reference Date: 05/20/2018 Using mammographic guidance, sterile technique, 1% lidocaine and an I-125 radioactive seed, residual microcalcifications over the lateral extent of the targeted area in the left breast were localized using a superior to inferior approach. The follow-up mammogram images confirm the seed in the expected location and were marked for Dr. Ninfa Linden. Follow-up survey of the patient confirms presence of the radioactive seed. Order number of I-125 seed:  [id number]. Total activity:  5.462 millicurie reference Date: 05/20/2018 The patient tolerated the procedure well and was released from the [facility]. She was given instructions regarding  seed removal. IMPRESSION: Radioactive seed localization left breast bracketing the area marked by the coil clip along its medial extent and residual microcalcifications along its lateral extent. No apparent complications. Electronically Signed   By: Marin Olp M.D.   On: 06/14/2018 14:29   Mm Lt Rad Seed Ea Add Lesion Loc Mammo  Result Date: 06/14/2018 CLINICAL DATA:  Patient presents for radioactive seed localization prior to surgical excision of biopsy-proven invasive mammary carcinoma left breast. Plan to bracket the medial edge of the targeted area marked by coil shaped post biopsy clip as well as the lateral edge of the targeted area marked by residual microcalcifications. EXAM: MAMMOGRAPHIC GUIDED RADIOACTIVE SEED LOCALIZATION OF THE LEFT BREAST COMPARISON:  Previous exam(s). FINDINGS: Patient presents for radioactive seed localization prior to surgical excision. I met with the patient and we discussed the procedure of seed localization including benefits and alternatives. We discussed the high likelihood of a successful procedure. We discussed the risks of the procedure including infection, bleeding, tissue injury and further surgery. We discussed the low dose of radioactivity involved in the procedure. Informed, written consent was given. The usual time-out protocol was performed immediately prior to the procedure. Using mammographic guidance, sterile technique, 1% lidocaine and an I-125 radioactive seed, the coil shaped biopsy clip was localized using a superior to inferior approach. The follow-up mammogram images confirm the seed in the expected location and were marked for Dr. Ninfa Linden. The seed lies immediately along the inferior medial aspect of the clip. Follow-up survey of the patient confirms presence of the radioactive seed. Order number of I-125 seed:  [id number]. Total activity:  1.829 millicurie reference Date: 05/20/2018 Using mammographic guidance, sterile technique, 1% lidocaine and an  I-125 radioactive seed,  residual microcalcifications over the lateral extent of the targeted area in the left breast were localized using a superior to inferior approach. The follow-up mammogram images confirm the seed in the expected location and were marked for Dr. Ninfa Linden. Follow-up survey of the patient confirms presence of the radioactive seed. Order number of I-125 seed:  [id number]. Total activity:  5.038 millicurie reference Date: 05/20/2018 The patient tolerated the procedure well and was released from the [facility]. She was given instructions regarding seed removal. IMPRESSION: Radioactive seed localization left breast bracketing the area marked by the coil clip along its medial extent and residual microcalcifications along its lateral extent. No apparent complications. Electronically Signed   By: Marin Olp M.D.   On: 06/14/2018 14:29    ELIGIBLE FOR AVAILABLE RESEARCH PROTOCOL: no   ASSESSMENT: 78 y.o. [place], [place] woman s/p left breast upper outer quadrant biopsy 04/25/2018 for a clinical T2N0, stage IB invasive ductal carcinoma (E-cadherin positive), grade 1 estrogen and progesterone receptor positive, HER-2 not amplified, with an MIB-1 of 3%  (1) Genetic testing performed through [facility]'s Breast Cancer STAT + Common Hereditary Cancers Panel on 05/09/2018 showed no pathogenic mutations. The STAT Breast cancer panel offered by [facility] includes sequencing and rearrangement analysis for the following 9 genes:  ATM, BRCA1, BRCA2, CDH1, CHEK2, PALB2, PTEN, STK11 and TP53.  The Common Hereditary Cancers Panel offered by [facility] includes sequencing and/or deletion duplication testing of the following 47 genes: APC, ATM, AXIN2, BARD1, BMPR1A, BRCA1, BRCA2, BRIP1, CDH1, CDKN2A (p14ARF), CDKN2A (p16INK4a), CKD4, CHEK2, CTNNA1, DICER1, EPCAM (Deletion/duplication testing only), GREM1 (promoter region deletion/duplication testing only), KIT, MEN1, MLH1, MSH2, MSH3, MSH6, MUTYH, NBN, NF1, NHTL1,  PALB2, PDGFRA, PMS2, POLD1, POLE, PTEN, RAD50, RAD51C, RAD51D, SDHB, SDHC, SDHD, SMAD4, SMARCA4. STK11, TP53, TSC1, TSC2, and VHL.  The following genes were evaluated for sequence changes only: SDHA and HOXB13 c.251G>A variant only.  (a) A variant of uncertain significance (VUS) in a gene called APC was also noted.  (2) status post left lumpectomy and sentinel lymph node sampling 06/19/2018 for a pT1a pN0, stage IA invasive ductal carcinoma, grade 1, with close but negative margins.  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  We reviewed the fact that mostly she had noninvasive disease, which is considered stage 0, in which is never treated with chemotherapy.  The standard of care is radiation and consideration of antiestrogens.  She also had a very small area of invasive disease.  We do not use chemotherapy in T1a tumors because the risk of systemic spread is so low.  Again the standard of care here is adjuvant radiation and consideration of antiestrogens  However Madison Hardin is very reluctant to undergo radiation.  We do have studies that in women over 81 with stage I tumors that are estrogen receptor positive radiation may be omitted without compromising survival.  There will be however a somewhat higher risk of local recurrence.  Madison Hardin is very numeric.  She wants to know her risk of local recurrence with surgery only, with surgery followed by radiation, with no radiation but antiestrogens, or with both radiation and antiestrogens.  I am referring that discussion to Dr. Isidore Moos  In the meantime she is agreeable to antiestrogens and there is no reason not to start now.  That way she will know whether she will be able to tolerate them. We discussed the difference between tamoxifen and  anastrozole in detail. She understands that anastrozole and the aromatase inhibitors in general work by blocking estrogen production. Accordingly vaginal dryness, decrease in bone density, and of course hot flashes can result. The aromatase inhibitors can also negatively affect the cholesterol profile, although that is a minor effect. One out of 5 women on aromatase inhibitors we will feel "old and achy". This arthralgia/myalgia syndrome, which resembles fibromyalgia clinically, does resolve with stopping the medications. Accordingly this is not a reason to not try an aromatase inhibitor but it is a frequent reason to stop it (in other words 20% of women will not be able to tolerate these medications).  Tamoxifen on the other hand does not block estrogen production. It does not "take away a woman's estrogen". It blocks the estrogen receptor in breast cells. Like anastrozole, it can also cause hot flashes. As opposed to anastrozole, tamoxifen has many estrogen-like effects. It is technically an estrogen receptor modulator. This means that in some tissues tamoxifen works like estrogen-- for example it helps strengthen the bones. It tends to improve the cholesterol profile. It can cause thickening of the endometrial lining, and even endometrial polyps or rarely cancer of the uterus.(The risk of uterine cancer due to tamoxifen is one additional cancer per thousand women year). It can cause vaginal wetness or stickiness. It can cause blood clots through this estrogen-like effect--the risk of blood clots with tamoxifen is exactly the same as with birth control pills or hormone replacement.  Neither of these agents causes mood changes or weight gain, despite the popular belief that they can have these side effects. We have data from studies comparing either of these drugs with placebo, and in those cases the control group had the same amount of weight gain and depression as the group that took the drug.  She  preferred anastrozole and I went ahead and put in the script for her to start.  She will see me in 8 to 10 weeks and if all goes well we will start longer-term follow-up at that point.  I spent approximately 30 minutes face to face with Madison Hardin with more than 50% of that time spent in counseling and coordination of care.     Magrinat, Virgie Dad, MD  07/01/18 5:18 PM Medical Oncology and Hematology [facility] [address] [place], [place] [address] Tel. [phone]    Fax. [phone]   I, Jacqualyn Posey am acting as a Education administrator for Chauncey Cruel, MD.   I, Lurline Del MD, have reviewed the above documentation for accuracy and completeness, and I agree with the above.

## 2018-07-01 ENCOUNTER — Inpatient Hospital Stay: Payer: Medicare Other | Attending: Oncology | Admitting: Oncology

## 2018-07-01 VITALS — BP 136/72 | HR 93 | Temp 98.6°F | Resp 18 | Ht 63.0 in | Wt 215.5 lb

## 2018-07-01 DIAGNOSIS — Z87891 Personal history of nicotine dependence: Secondary | ICD-10-CM

## 2018-07-01 DIAGNOSIS — Z79811 Long term (current) use of aromatase inhibitors: Secondary | ICD-10-CM | POA: Insufficient documentation

## 2018-07-01 DIAGNOSIS — Z170 Estrogen receptor positive status [ER+]: Secondary | ICD-10-CM | POA: Insufficient documentation

## 2018-07-01 DIAGNOSIS — C50412 Malignant neoplasm of upper-outer quadrant of left female breast: Secondary | ICD-10-CM | POA: Insufficient documentation

## 2018-07-01 DIAGNOSIS — Z79899 Other long term (current) drug therapy: Secondary | ICD-10-CM | POA: Insufficient documentation

## 2018-07-01 MED ORDER — ANASTROZOLE 1 MG PO TABS: 1.0000 mg | ORAL_TABLET | Freq: Every day | ORAL | 4 refills | Status: DC

## 2018-07-03 NOTE — Progress Notes (Addendum)
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  Receptor Status: ER(100%), PR (100%), Her2-neu (NEG), Ki-(3%)  06/17/18 Diagnosis 1. 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 SEE NOTE. - DUCTAL CARCINOMA IN SITU, INTERMEDIATE NUCLEAR GRADE WITH CALCIFICATIONS. - CARCINOMA IS 0.2 CM AND DCIS IS LESS THAN 0.1 CM FROM THE POSTERIOR MARGIN; OTHER MARGINS ARE NEGATIVE FOR CARCINOMA. - NEGATIVE FOR LYMPHOVASCULAR OR PERINEURAL INVASION. - BIOPSY SITE CHANGES. - SEE ONCOLOGY TABLE. 2. Lymph node, sentinel, biopsy, Left Axillary - LYMPH NODE, NEGATIVE FOR CARCINOMA (0/1). 3. Lymph node, sentinel, biopsy, Left Axillary - LYMPH NODE, NEGATIVE FOR CARCINOMA (0/1).  Did patient present with symptoms or was this found on screening mammography?: It was found on a screening mammogram.   Past/Anticipated interventions by surgeon, if any: 06/17/18 Procedure(s): LEFT BREAST PARTIAL MASTECTOMY WITH RADIOACTIVE SEED AND DEEP LEFT AXILLARY SENTINEL LYMPH NODE BIOPSY Surgeon(s): Coralie Keens, MD  Past/Anticipated interventions by medical oncology, if any:  Dr. Jana Hakim 07/01/18  -status post left lumpectomy and sentinel lymph node sampling 06/19/2018 for a pT1a pN0, stage IA invasive ductal carcinoma, grade 1, with close but negative margins.             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  We reviewed the fact that mostly she had noninvasive disease, which is considered stage 0, in which is never treated with chemotherapy.  The standard of care is radiation and  consideration of antiestrogens. -She also had a very small area of invasive disease.  We do not use chemotherapy in T1a tumors because the risk of systemic spread is so low.  Again the standard of care here is adjuvant radiation and consideration of antiestrogens - However Dariella is very reluctant to undergo radiation.  We do have studies that in women over 66 with stage I tumors that are estrogen receptor positive radiation may be omitted without compromising survival.  There will be however a somewhat higher risk of local recurrence.  Sharina is very numeric.  She wants to know her risk of local recurrence with surgery only, with surgery followed by radiation, with no radiation but antiestrogens, or with both radiation and antiestrogens.  I am referring that discussion to Dr. Isidore Moos   Lymphedema issues, if any:  She denies. She reports good arm mobility.   Pain issues, if any: Left knee/foot pain. She reports that this pain is chronic and mild.  She also reports mild tenderness to incision site.   SAFETY ISSUES:  Prior radiation? No  Pacemaker/ICD? No  Possible current pregnancy? No  Is the patient on methotrexate? No  Current Complaints / other details:   She wanted to mention to Dr. Isidore Moos that she remembers having multiple chest x-rays as a child.   BP 133/70 (BP Location: Left Arm, Patient Position: Sitting)   Pulse 72   Temp 98.4 F (36.9 C) (Oral)   Resp 20   Ht _0  (1.6 m)   Wt 215 lb (97.5 kg)   SpO2 100%   BMI 38.09 kg/m    Wt Readings from Last 3 Encounters:  07/10/18 215 lb (97.5 kg)  07/01/18  215 lb 8 oz (97.8 kg)  06/17/18 214 lb (97.1 kg)

## 2018-07-10 ENCOUNTER — Ambulatory Visit
Admission: RE | Admit: 2018-07-10 | Discharge: 2018-07-10 | Disposition: A | Discharge status: Home / Self Care | Payer: Medicare Other | Source: Ambulatory Visit | Attending: Radiation Oncology | Admitting: Radiation Oncology

## 2018-07-10 ENCOUNTER — Ambulatory Visit: Payer: Medicare Other | Admitting: Radiation Oncology

## 2018-07-10 ENCOUNTER — Other Ambulatory Visit: Payer: Self-pay

## 2018-07-10 ENCOUNTER — Encounter: Payer: Self-pay | Admitting: Radiation Oncology

## 2018-07-10 DIAGNOSIS — C50412 Malignant neoplasm of upper-outer quadrant of left female breast: Secondary | ICD-10-CM

## 2018-07-10 DIAGNOSIS — Z170 Estrogen receptor positive status [ER+]: Principal | ICD-10-CM

## 2018-07-10 DIAGNOSIS — Z9889 Other specified postprocedural states: Secondary | ICD-10-CM | POA: Diagnosis not present

## 2018-07-10 DIAGNOSIS — Z79899 Other long term (current) drug therapy: Secondary | ICD-10-CM | POA: Insufficient documentation

## 2018-07-10 DIAGNOSIS — Z79811 Long term (current) use of aromatase inhibitors: Secondary | ICD-10-CM | POA: Diagnosis not present

## 2018-07-10 DIAGNOSIS — D0512 Intraductal carcinoma in situ of left breast: Secondary | ICD-10-CM | POA: Diagnosis not present

## 2018-07-10 NOTE — Progress Notes (Signed)
Radiation Oncology         [phone] ________________________________  Name: Madison Hardin MRN: [id number]  Date: 07/10/2018  DOB: [birth year]  Follow-Up Visit Note  Outpatient  CC: Javier Glazier, MD  Coralie Keens, MD  Diagnosis:      ICD-10-CM   1. Malignant neoplasm of upper-outer quadrant of left breast in female, estrogen receptor positive ([place]) C50.412    Z17.0   Cancer Staging Malignant neoplasm of upper-outer quadrant of left breast in female, estrogen receptor positive ([place]) Staging form: Breast, AJCC 8th Edition - Pathologic: Stage IA (pT1a, pN0, cM0, G1, ER+, PR+, HER2-) - Unsigned  CHIEF COMPLAINT: Here to discuss management of left breast cancer  Narrative:  The patient returns today for follow-up.     Since consultation date, she underwent lumpectomy showing a predominance of intermediate grade DCIS with 52m of invasive ductal carcinoma, grade 1, with close margins.  She has started antiestrogens and is doing well. She would like to talk about the statistics regarding risk of local recurrence with various adjuvant therapies.        ALLERGIES:  has No Known Allergies.  Meds: Current Outpatient Medications  Medication Sig Dispense Refill  . anastrozole (ARIMIDEX) 1 MG tablet Take 1 tablet (1 mg total) by mouth daily. 90 tablet 4  . Cholecalciferol (VITAMIN D) 125 MCG (5000 UT) CAPS Take 5,000 Units by mouth daily.     .Marland KitchenMAGNESIUM PO Take 320 mg by mouth daily.     . Multiple Vitamins-Minerals (ICAPS AREDS 2 PO) Take 1 tablet by mouth 2 (two) times daily.     .Marland Kitchenomeprazole (PRILOSEC) 10 MG capsule Take 1 capsule (10 mg total) by mouth daily. 90 capsule 3  . telmisartan (MICARDIS) 40 MG tablet Take 1 tablet (40 mg total) by mouth daily. 90 tablet 3  . oxyCODONE (OXY IR/ROXICODONE) 5 MG immediate release tablet Take 1 tablet (5 mg total) by mouth every 6 (six) hours as needed for moderate pain or severe pain. 30 tablet 0   No current  facility-administered medications for this encounter.     Physical Findings:  height is '5\' 3"'  (1.6 m) and weight is 215 lb (97.5 kg). Her oral temperature is 98.4 F (36.9 C). Her blood pressure is 133/70 and her pulse is 72. Her respiration is 20 and oxygen saturation is 100%. .     General: Alert and oriented, in no acute distress   Lab Findings: Lab Results  Component Value Date   WBC 6.9 06/12/2018   HGB 15.4 (H) 06/12/2018   HCT 48.9 (H) 06/12/2018   MCV 93.1 06/12/2018   PLT 281 06/12/2018    '@LASTCHEMISTRY' @  Radiographic Findings: Nm Sentinel Node Inj-no Rpt (breast)  Result Date: 06/17/2018 Sulfur colloid was injected by the nuclear medicine technologist for melanoma sentinel node.   Mm Breast Surgical Specimen  Result Date: 06/17/2018 CLINICAL DATA:  Patient had a bracket radioactive seed localization. Biopsy proven left breast cancer. EXAM: SPECIMEN RADIOGRAPH OF THE LEFT BREAST COMPARISON:  Previous exam(s). FINDINGS: Status post excision of the left breast. The radioactive seeds and biopsy marker clip are present, completely intact, and were marked for pathology. IMPRESSION: Specimen radiograph of the left breast. Electronically Signed   By: DLillia MountainM.D.   On: 06/17/2018 10:59   Mm Lt Radioactive Seed Loc Mammo Guide  Result Date: 06/14/2018 CLINICAL DATA:  Patient presents for radioactive seed localization prior to surgical excision of biopsy-proven invasive mammary carcinoma left breast. Plan  to bracket the medial edge of the targeted area marked by coil shaped post biopsy clip as well as the lateral edge of the targeted area marked by residual microcalcifications. EXAM: MAMMOGRAPHIC GUIDED RADIOACTIVE SEED LOCALIZATION OF THE LEFT BREAST COMPARISON:  Previous exam(s). FINDINGS: Patient presents for radioactive seed localization prior to surgical excision. I met with the patient and we discussed the procedure of seed localization including benefits and alternatives.  We discussed the high likelihood of a successful procedure. We discussed the risks of the procedure including infection, bleeding, tissue injury and further surgery. We discussed the low dose of radioactivity involved in the procedure. Informed, written consent was given. The usual time-out protocol was performed immediately prior to the procedure. Using mammographic guidance, sterile technique, 1% lidocaine and an I-125 radioactive seed, the coil shaped biopsy clip was localized using a superior to inferior approach. The follow-up mammogram images confirm the seed in the expected location and were marked for Dr. Ninfa Linden. The seed lies immediately along the inferior medial aspect of the clip. Follow-up survey of the patient confirms presence of the radioactive seed. Order number of I-125 seed:  [id number]. Total activity:  9.024 millicurie reference Date: 05/20/2018 Using mammographic guidance, sterile technique, 1% lidocaine and an I-125 radioactive seed, residual microcalcifications over the lateral extent of the targeted area in the left breast were localized using a superior to inferior approach. The follow-up mammogram images confirm the seed in the expected location and were marked for Dr. Ninfa Linden. Follow-up survey of the patient confirms presence of the radioactive seed. Order number of I-125 seed:  [id number]. Total activity:  2.426 millicurie reference Date: 05/20/2018 The patient tolerated the procedure well and was released from the [facility]. She was given instructions regarding seed removal. IMPRESSION: Radioactive seed localization left breast bracketing the area marked by the coil clip along its medial extent and residual microcalcifications along its lateral extent. No apparent complications. Electronically Signed   By: Marin Olp M.D.   On: 06/14/2018 14:29   Mm Lt Rad Seed Ea Add Lesion Loc Mammo  Result Date: 06/14/2018 CLINICAL DATA:  Patient presents for radioactive seed localization  prior to surgical excision of biopsy-proven invasive mammary carcinoma left breast. Plan to bracket the medial edge of the targeted area marked by coil shaped post biopsy clip as well as the lateral edge of the targeted area marked by residual microcalcifications. EXAM: MAMMOGRAPHIC GUIDED RADIOACTIVE SEED LOCALIZATION OF THE LEFT BREAST COMPARISON:  Previous exam(s). FINDINGS: Patient presents for radioactive seed localization prior to surgical excision. I met with the patient and we discussed the procedure of seed localization including benefits and alternatives. We discussed the high likelihood of a successful procedure. We discussed the risks of the procedure including infection, bleeding, tissue injury and further surgery. We discussed the low dose of radioactivity involved in the procedure. Informed, written consent was given. The usual time-out protocol was performed immediately prior to the procedure. Using mammographic guidance, sterile technique, 1% lidocaine and an I-125 radioactive seed, the coil shaped biopsy clip was localized using a superior to inferior approach. The follow-up mammogram images confirm the seed in the expected location and were marked for Dr. Ninfa Linden. The seed lies immediately along the inferior medial aspect of the clip. Follow-up survey of the patient confirms presence of the radioactive seed. Order number of I-125 seed:  [id number]. Total activity:  9.798 millicurie reference Date: 05/20/2018 Using mammographic guidance, sterile technique, 1% lidocaine and an I-125 radioactive seed, residual microcalcifications over the lateral  extent of the targeted area in the left breast were localized using a superior to inferior approach. The follow-up mammogram images confirm the seed in the expected location and were marked for Dr. Ninfa Linden. Follow-up survey of the patient confirms presence of the radioactive seed. Order number of I-125 seed:  [id number]. Total activity:  4.431 millicurie  reference Date: 05/20/2018 The patient tolerated the procedure well and was released from the [facility]. She was given instructions regarding seed removal. IMPRESSION: Radioactive seed localization left breast bracketing the area marked by the coil clip along its medial extent and residual microcalcifications along its lateral extent. No apparent complications. Electronically Signed   By: Marin Olp M.D.   On: 06/14/2018 14:29    Impression/Plan: We discussed adjuvant radiotherapy today. She understands that according to the [facility] nomogram, her risk of local recurrence is about 11% over the next decade if she continues antiestrogens.  Adding RT to this would cut that risk further, by about half.    She and I also re-reviewed the risks and benefits of RT in detail.  After a discussion she would like to stick with antiestogens alone and forgo RT.  I reminded her to continue yearly mammograms.  She will followup with medical oncology.    I wished her the best.  I spent 20 minutes minutes face to face with the patient and more than 50% of that time was spent in counseling and/or coordination of care. _____________________________________   Eppie Gibson, MD

## 2018-07-15 ENCOUNTER — Telehealth: Payer: Self-pay | Admitting: *Deleted

## 2018-07-15 ENCOUNTER — Other Ambulatory Visit: Payer: Self-pay | Admitting: *Deleted

## 2018-07-15 NOTE — Telephone Encounter (Signed)
This RN spoke with pt per her call regarding bone density - which is not scheduled until April vs expected request date of later this month.  Per GI/BC April is the next available .  Note pt stated " since I have you on the phone " she inquired about " what medications can I take with this medications because I am reading things that say not to take aspirin or cough medication with it "   This RN inquired about " where" she is reading the above ( pharmacy given information or other ) with Madison Hardin stating " Oh I read things all over the place including the internet "  This RN discussed ok to use OTC medications with arimidex.  No further questions at this time.

## 2018-07-30 DIAGNOSIS — G8929 Other chronic pain: Secondary | ICD-10-CM | POA: Diagnosis not present

## 2018-07-30 DIAGNOSIS — M5416 Radiculopathy, lumbar region: Secondary | ICD-10-CM | POA: Diagnosis not present

## 2018-07-30 DIAGNOSIS — K219 Gastro-esophageal reflux disease without esophagitis: Secondary | ICD-10-CM | POA: Diagnosis not present

## 2018-07-30 DIAGNOSIS — R5383 Other fatigue: Secondary | ICD-10-CM | POA: Diagnosis not present

## 2018-07-30 DIAGNOSIS — Z23 Encounter for immunization: Secondary | ICD-10-CM | POA: Diagnosis not present

## 2018-07-30 DIAGNOSIS — M25552 Pain in left hip: Secondary | ICD-10-CM | POA: Diagnosis not present

## 2018-07-30 DIAGNOSIS — E785 Hyperlipidemia, unspecified: Secondary | ICD-10-CM | POA: Diagnosis not present

## 2018-07-30 DIAGNOSIS — R7309 Other abnormal glucose: Secondary | ICD-10-CM | POA: Diagnosis not present

## 2018-07-30 DIAGNOSIS — M25562 Pain in left knee: Secondary | ICD-10-CM | POA: Diagnosis not present

## 2018-07-30 DIAGNOSIS — M1810 Unilateral primary osteoarthritis of first carpometacarpal joint, unspecified hand: Secondary | ICD-10-CM | POA: Diagnosis not present

## 2018-07-30 DIAGNOSIS — I10 Essential (primary) hypertension: Secondary | ICD-10-CM | POA: Diagnosis not present

## 2018-07-30 DIAGNOSIS — M79652 Pain in left thigh: Secondary | ICD-10-CM | POA: Diagnosis not present

## 2018-07-31 DIAGNOSIS — I10 Essential (primary) hypertension: Secondary | ICD-10-CM | POA: Diagnosis not present

## 2018-07-31 DIAGNOSIS — R5383 Other fatigue: Secondary | ICD-10-CM | POA: Diagnosis not present

## 2018-07-31 DIAGNOSIS — R7309 Other abnormal glucose: Secondary | ICD-10-CM | POA: Diagnosis not present

## 2018-07-31 DIAGNOSIS — E785 Hyperlipidemia, unspecified: Secondary | ICD-10-CM | POA: Diagnosis not present

## 2018-07-31 DIAGNOSIS — Z0189 Encounter for other specified special examinations: Secondary | ICD-10-CM | POA: Diagnosis not present

## 2018-08-06 DIAGNOSIS — G8929 Other chronic pain: Secondary | ICD-10-CM | POA: Diagnosis not present

## 2018-08-06 DIAGNOSIS — M25562 Pain in left knee: Secondary | ICD-10-CM | POA: Diagnosis not present

## 2018-08-06 DIAGNOSIS — M1712 Unilateral primary osteoarthritis, left knee: Secondary | ICD-10-CM | POA: Diagnosis not present

## 2018-08-06 DIAGNOSIS — M25552 Pain in left hip: Secondary | ICD-10-CM | POA: Diagnosis not present

## 2018-08-06 DIAGNOSIS — M7062 Trochanteric bursitis, left hip: Secondary | ICD-10-CM | POA: Diagnosis not present

## 2018-08-20 ENCOUNTER — Other Ambulatory Visit: Payer: Self-pay | Admitting: *Deleted

## 2018-08-20 MED ORDER — ANASTROZOLE 1 MG PO TABS: 1.0000 mg | ORAL_TABLET | Freq: Every day | ORAL | 0 refills | Status: DC

## 2018-09-02 ENCOUNTER — Encounter: Payer: Self-pay | Admitting: Oncology

## 2018-09-13 ENCOUNTER — Other Ambulatory Visit: Payer: Medicare Other

## 2018-09-16 ENCOUNTER — Encounter: Payer: Self-pay | Admitting: Oncology

## 2018-09-18 ENCOUNTER — Other Ambulatory Visit: Payer: Self-pay | Admitting: Oncology

## 2018-09-20 ENCOUNTER — Telehealth: Payer: Self-pay | Admitting: Oncology

## 2018-09-20 NOTE — Telephone Encounter (Signed)
Called patient regarding upcoming Webex meeting, test run complete and e-mail has been sent.

## 2018-09-22 NOTE — Progress Notes (Signed)
[place]  [phone] [phone]     ID: Madison Hardin DOB: [birth year]  MR#: [id number]  ZYY#:[id number]  Patient Care Team: Javier Glazier, MD as PCP - General (Internal Medicine) Coralie Keens, MD as Consulting Physician (General Surgery) Eppie Gibson, MD as Attending Physician (Radiation Oncology) Yeslin Delio, Virgie Dad, MD as Consulting Physician (Oncology) Pyrtle, Lajuan Lines, MD as Consulting Physician (Gastroenterology) OTHER MD:   CHIEF COMPLAINT: Estrogen receptor positive breast cancer  CURRENT TREATMENT: Anastrozole   I connected with Madison Hardin on 09/23/18 at  3:00 PM EDT by video enabled telemedicine visit and verified that I am speaking with the correct person using two identifiers.   I discussed the limitations, risks, security and privacy concerns of performing an evaluation and management service by telemedicine and the availability of in-person appointments. I also discussed with the patient that there may be a patient responsible charge related to this service. The patient expressed understanding and agreed to proceed.   Other persons participating in the visit and their role in the encounter:   - Biomedical scientist, Medical Scribe   Patient's location: home  Provider's location: [facility]   Chief Complaint: estrogen receptor positive breast cancer    HISTORY OF CURRENT ILLNESS:  From the original intake note:  Madison Hardin had routine screening mammography on 04/10/2018 showing a possible abnormality in the left breast. She underwent unilateral left diagnostic mammography with tomography and left breast ultrasonography at [facility] on 04/23/2018 showing: Breast Density Category B. In the middle third of the slightly upper and outer left breast is a group of heterogeneous calcifications spanning 2.5 x 2.5 x 1.4 cm on mammography. Ultrasound of the left axilla demonstrates multiple  normal-appearing lymph nodes. No suspicious masses or lymph nodes with cortical thickening are identified by ultrasonography.   Accordingly on 04/25/2018 she proceeded to biopsy of the left breast area in question. The pathology from this procedure showed [id number]): Invasive mammary carcinoma, e-cadherin positive, grade 1 Prognostic indicators significant for: estrogen receptor, 100% positive and progesterone receptor, 100% positive, both with strong staining intensity. Proliferation marker Ki67 at 3%. HER2 negative immunohistochemical and morphometric analysis (1+).   The patient's subsequent history is as detailed below.   INTERVAL HISTORY: Madison Hardin returns today for follow-up and treatment of her estrogen receptor positive breast cancer.   She continues on anastrozole. She tolerates this well and without any noticeable side effects. She paid $15 for this medication. She notes that she had a prednisone shot about two months ago which has helped with her arthritic pain. She notes that her pain has begun to come back but it is very manageable. As a result of this, she is unsure if the pain is due to her prior arthritic pain or due to anastrozole.   Since her last visit here she met with Dr. Isidore Moos who quoted her a local recurrence risk of approximately 11% with antiestrogens but no radiation. She understood that radiation would cut that risk by at least a half. After that discussion the patient made a decision against radiation.   She is scheduled for a bone density screening on 11/18/2018.   Since her last visit here, she has not undergone any additional studies.     REVIEW OF SYSTEMS: Madison Hardin has been walking about 4 miles per day, and she notes that she has lost 13 lbs. She has brooks runners for shoes. She changed her diet around the time that she was  diagnosed with breast cancer, and she reports that she has lost around 40 lbs since then. She states that she does not have a weight goal, but  that she currently weighs 195 lbs. Madison Hardin notes that her cholesterol is "off." She has questions around baselines studies. She notes some hair thinning, which she has treated unsuccessfully with Rogaine and she is concerned some about hair loss from anastrozole.    The patient denies unusual headaches, visual changes, nausea, vomiting, or dizziness. There has been no unusual cough, phlegm production, or pleurisy. This been no change in bowel or bladder habits. The patient denies unexplained fatigue or unexplained weight loss, bleeding, rash, or fever. A detailed review of systems was otherwise noncontributory.     PAST MEDICAL HISTORY: Past Medical History:  Diagnosis Date  . Arthritis   . Cataract 03/2018   Surgery on both eyes in 03/2018  . Family history of breast cancer   . Family history of pancreatic cancer   . Family history of prostate cancer   . GERD (gastroesophageal reflux disease)   . Hyperlipidemia   . Hypertension   . Malignant neoplasm of upper-outer quadrant of left breast in female, estrogen receptor positive ([place]) 05/15/2018  . Pneumonia    as child     PAST SURGICAL HISTORY: Past Surgical History:  Procedure Laterality Date  . ABDOMINAL HYSTERECTOMY    . AUGMENTATION MAMMAPLASTY     Pt had explants  . BREAST EXCISIONAL BIOPSY Left   . BREAST LUMPECTOMY WITH RADIOACTIVE SEED AND SENTINEL LYMPH NODE BIOPSY Left 06/17/2018   Procedure: LEFT BREAST PARTIAL MASTECTOMY WITH RADIOACTIVE SEED AND SENTINEL LYMPH NODE BIOPSY;  Surgeon: Coralie Keens, MD;  Location: [place];  Service: General;  Laterality: Left;  . BREAST SURGERY    . COLONOSCOPY     last 2010 in [place]  . EYE SURGERY     cataracts  . KIDNEY STONE SURGERY     flushed per pt.  Marland Kitchen POLYPECTOMY     last 2010  . TONSILLECTOMY    . UPPER GASTROINTESTINAL ENDOSCOPY    . UTERINE FIBROID SURGERY  1980     FAMILY HISTORY Family History  Problem Relation Age of Onset  . Breast cancer Mother 13        without treatment  . Prostate cancer Father 41  . Penile cancer Father   . Leukemia Paternal Grandmother   . Pancreatic cancer Paternal Aunt        dx late 70s  . Colon cancer Neg Hx   . Rectal cancer Neg Hx   . Stomach cancer Neg Hx   . Esophageal cancer Neg Hx   . Colon polyps Neg Hx    The patient's father had a history of prostate and penile cancer; he died at age 78. Patients' mother died from breast cancer at age 93.  Apparently she can seal the cancer for a long time before bringing it to medical attention and then refused treatment.  The patient has no siblings. Patient denies anyone in her family having ovarian cancer. The patient's paternal aunt had pancreatic cancer.   GYNECOLOGIC HISTORY:  Menarche: 78 years old Age at first live birth:  [place] P: 0 LMP: No LMP recorded. Patient has had a hysterectomy. 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 She lives alone and has no pets. She has  no children. She does not attend a church/synagog/mosque.   ADVANCED DIRECTIVES: To be discussed   HEALTH MAINTENANCE: Social History   Tobacco Use  . Smoking status: Former Smoker    Last attempt to quit: 06/05/1978    Years since quitting: 40.3  . Smokeless tobacco: Never Used  Substance Use Topics  . Alcohol use: No  . Drug use: No     Colonoscopy: yes, within the last year  PAP: >5 years ago  Bone density: yes, not recent   No Known Allergies  Current Outpatient Medications  Medication Sig Dispense Refill  . anastrozole (ARIMIDEX) 1 MG tablet Take 1 tablet (1 mg total) by mouth daily. 90 tablet 0  . Cholecalciferol (VITAMIN D) 125 MCG (5000 UT) CAPS Take 5,000 Units by mouth daily.     Marland Kitchen MAGNESIUM PO Take 320 mg by mouth daily.     . Multiple Vitamins-Minerals (ICAPS AREDS 2 PO) Take 1 tablet by mouth 2 (two) times daily.      Marland Kitchen omeprazole (PRILOSEC) 10 MG capsule Take 1 capsule (10 mg total) by mouth daily. 90 capsule 3  . oxyCODONE (OXY IR/ROXICODONE) 5 MG immediate release tablet Take 1 tablet (5 mg total) by mouth every 6 (six) hours as needed for moderate pain or severe pain. 30 tablet 0  . telmisartan (MICARDIS) 40 MG tablet Take 1 tablet (40 mg total) by mouth daily. 90 tablet 3   No current facility-administered medications for this visit.      OBJECTIVE: Older white woman in no acute distress There were no vitals filed for this visit.   There is no height or weight on file to calculate BMI.   Wt Readings from Last 3 Encounters:  07/10/18 215 lb (97.5 kg)  07/01/18 215 lb 8 oz (97.8 kg)  06/17/18 214 lb (97.1 kg)      ECOG FS:1 - Symptomatic but completely ambulatory  LAB RESULTS:  CMP     Component Value Date/Time   NA 139 06/12/2018 1018   NA 142 04/17/2012   K 4.0 06/12/2018 1018   CL 105 06/12/2018 1018   CO2 22 06/12/2018 1018   GLUCOSE 106 (H) 06/12/2018 1018   BUN 19 06/12/2018 1018   BUN 19 04/17/2012   CREATININE 0.84 06/12/2018 1018   CREATININE 0.90 05/15/2018 1535   CREATININE 0.71 09/08/2016 1117   CALCIUM 9.6 06/12/2018 1018   PROT 7.1 05/15/2018 1535   ALBUMIN 3.8 05/15/2018 1535   AST 16 05/15/2018 1535   ALT 15 05/15/2018 1535   ALKPHOS 82 05/15/2018 1535   BILITOT 0.5 05/15/2018 1535   GFRNONAA >60 06/12/2018 1018   GFRNONAA >60 05/15/2018 1535   GFRAA >60 06/12/2018 1018   GFRAA >60 05/15/2018 1535    No results found for: TOTALPROTELP, ALBUMINELP, A1GS, A2GS, BETS, BETA2SER, GAMS, MSPIKE, SPEI  No results found for: KPAFRELGTCHN, LAMBDASER, KAPLAMBRATIO  Lab Results  Component Value Date   WBC 6.9 06/12/2018   NEUTROABS 5.4 05/15/2018   HGB 15.4 (H) 06/12/2018   HCT 48.9 (H) 06/12/2018   MCV 93.1 06/12/2018   PLT 281 06/12/2018    _0 @  No results found for: LABCA2  No components found for: [id number]  No results for input(s): INR in  the last 168 hours.  No results found for: LABCA2  No results found for: KLK917  No results found for: HXT056  No results found for: PVX480  No results found for: CA2729  No components found for: HGQUANT  No results  found for: CEA1 / No results found for: CEA1   No results found for: AFPTUMOR  No results found for: CHROMOGRNA  No results found for: PSA1  No visits with results within 3 Day(s) from this visit.  Latest known visit with results is:  Hospital Outpatient Visit on 06/12/2018  Component Date Value Ref Range Status  . Sodium 06/12/2018 139  135 - 145 mmol/L Final  . Potassium 06/12/2018 4.0  3.5 - 5.1 mmol/L Final  . Chloride 06/12/2018 105  98 - 111 mmol/L Final  . CO2 06/12/2018 22  22 - 32 mmol/L Final  . Glucose, Bld 06/12/2018 106* 70 - 99 mg/dL Final  . BUN 06/12/2018 19  8 - 23 mg/dL Final  . Creatinine, Ser 06/12/2018 0.84  0.44 - 1.00 mg/dL Final  . Calcium 06/12/2018 9.6  8.9 - 10.3 mg/dL Final  . GFR calc non Af Amer 06/12/2018 >60  >60 mL/min Final  . GFR calc Af Amer 06/12/2018 >60  >60 mL/min Final  . Anion gap 06/12/2018 12  5 - 15 Final   Performed at [facility], [place] [address], [place], [place] [address]  . WBC 06/12/2018 6.9  4.0 - 10.5 K/uL Final  . RBC 06/12/2018 5.25* 3.87 - 5.11 MIL/uL Final  . Hemoglobin 06/12/2018 15.4* 12.0 - 15.0 g/dL Final  . HCT 06/12/2018 48.9* 36.0 - 46.0 % Final  . MCV 06/12/2018 93.1  80.0 - 100.0 fL Final  . MCH 06/12/2018 29.3  26.0 - 34.0 pg Final  . MCHC 06/12/2018 31.5  30.0 - 36.0 g/dL Final  . RDW 06/12/2018 13.1  11.5 - 15.5 % Final  . Platelets 06/12/2018 281  150 - 400 K/uL Final  . nRBC 06/12/2018 0.0  0.0 - 0.2 % Final   Performed at [facility], [place] [address] [place], [place] [address]    (this displays the last labs from the last 3 days)  No results found for: TOTALPROTELP, ALBUMINELP, A1GS, A2GS, BETS, BETA2SER, GAMS, MSPIKE, SPEI (this displays SPEP labs)  No results found  for: KPAFRELGTCHN, LAMBDASER, KAPLAMBRATIO (kappa/lambda light chains)  No results found for: HGBA, HGBA2QUANT, HGBFQUANT, HGBSQUAN (Hemoglobinopathy evaluation)   No results found for: LDH  No results found for: IRON, TIBC, IRONPCTSAT (Iron and TIBC)  No results found for: FERRITIN  Urinalysis No results found for: COLORURINE, APPEARANCEUR, LABSPEC, PHURINE, GLUCOSEU, HGBUR, BILIRUBINUR, KETONESUR, PROTEINUR, UROBILINOGEN, NITRITE, LEUKOCYTESUR   STUDIES:  Bone density has been scheduled for mid June; she will be due for repeat mammography in November  ELIGIBLE FOR AVAILABLE RESEARCH PROTOCOL: no   ASSESSMENT: 78 y.o. [place], [place] woman s/p left breast upper outer quadrant biopsy 04/25/2018 for a clinical T2N0, stage IB invasive ductal carcinoma (E-cadherin positive), grade 1 estrogen and progesterone receptor positive, HER-2 not amplified, with an MIB-1 of 3%  (1) Genetic testing performed through [facility]'s Breast Cancer STAT + Common Hereditary Cancers Panel on 05/09/2018 showed no pathogenic mutations. The STAT Breast cancer panel offered by [facility] includes sequencing and rearrangement analysis for the following 9 genes:  ATM, BRCA1, BRCA2, CDH1, CHEK2, PALB2, PTEN, STK11 and TP53.  The Common Hereditary Cancers Panel offered by [facility] includes sequencing and/or deletion duplication testing of the following 47 genes: APC, ATM, AXIN2, BARD1, BMPR1A, BRCA1, BRCA2, BRIP1, CDH1, CDKN2A (p14ARF), CDKN2A (p16INK4a), CKD4, CHEK2, CTNNA1, DICER1, EPCAM (Deletion/duplication testing only), GREM1 (promoter region deletion/duplication testing only), KIT, MEN1, MLH1, MSH2, MSH3, MSH6, MUTYH, NBN, NF1, NHTL1, PALB2, PDGFRA, PMS2, POLD1, POLE, PTEN,  RAD50, RAD51C, RAD51D, SDHB, SDHC, SDHD, SMAD4, SMARCA4. STK11, TP53, TSC1, TSC2, and VHL.  The following genes were evaluated for sequence changes only: SDHA and HOXB13 c.251G>A variant only.  (a) A variant of uncertain significance (VUS) in a gene  called APC was also noted.  (2) status post left lumpectomy and sentinel lymph node sampling 06/19/2018 for a pT1a pN0, stage IA invasive ductal carcinoma, grade 1, with close but negative margins.  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  Given her age, her excellent prognosis, and the fact that she is tolerating anastrozole well, and no problems with that decision.  The plan then will be for anastrozole a total of 5 years.  So far she is doing great with the medication.  We will be obtaining a baseline bone density mid June.  If there is significant osteopenia or osteoporosis we will consider Prolia.  Lorah has lost a great deal of weight and she is still thinking of losing another 50 pounds.  Apparently she has been as high as 300 pounds in the past.  She is now down to 195 she says.  She is walking 4 miles a day and she has a good diet.  I am all for it but it will be important to make sure that she eats a proper mix of nutrition and we will discuss this when she returns to see me in July.  If everything is well and she does not need Prolia probably I will start seeing her on a once a year basis after the next visit  She knows to call for any other issues that may develop before then.    Chyrel Taha, Virgie Dad, MD  09/23/18 3:19 PM Medical Oncology and Hematology [facility] [address] [place], [place] [address] Tel. [phone]    Fax. [phone]   I, Jacqualyn Posey am acting as a Education administrator for Chauncey Cruel, MD.   I, Lurline Del MD, have reviewed the above documentation for accuracy and completeness, and I agree with the  above.

## 2018-09-23 ENCOUNTER — Inpatient Hospital Stay: Payer: Medicare Other | Attending: Oncology | Admitting: Oncology

## 2018-09-23 DIAGNOSIS — Z170 Estrogen receptor positive status [ER+]: Secondary | ICD-10-CM

## 2018-09-23 DIAGNOSIS — C50412 Malignant neoplasm of upper-outer quadrant of left female breast: Secondary | ICD-10-CM

## 2018-09-24 ENCOUNTER — Telehealth: Payer: Self-pay | Admitting: Oncology

## 2018-09-24 NOTE — Telephone Encounter (Signed)
Tried to reach regarding 7/20

## 2018-09-27 ENCOUNTER — Encounter: Payer: Self-pay | Admitting: *Deleted

## 2018-10-02 ENCOUNTER — Other Ambulatory Visit: Payer: Self-pay | Admitting: Oncology

## 2018-11-17 ENCOUNTER — Other Ambulatory Visit: Payer: Self-pay | Admitting: Oncology

## 2018-11-18 ENCOUNTER — Ambulatory Visit
Admission: RE | Admit: 2018-11-18 | Discharge: 2018-11-18 | Disposition: A | Discharge status: Home / Self Care | Payer: Medicare Other | Source: Ambulatory Visit | Attending: Oncology | Admitting: Oncology

## 2018-11-18 ENCOUNTER — Other Ambulatory Visit: Payer: Self-pay

## 2018-11-18 DIAGNOSIS — Z780 Asymptomatic menopausal state: Secondary | ICD-10-CM | POA: Diagnosis not present

## 2018-11-18 DIAGNOSIS — M85852 Other specified disorders of bone density and structure, left thigh: Secondary | ICD-10-CM | POA: Diagnosis not present

## 2018-11-18 DIAGNOSIS — C50412 Malignant neoplasm of upper-outer quadrant of left female breast: Secondary | ICD-10-CM

## 2018-12-03 ENCOUNTER — Telehealth: Payer: Self-pay | Admitting: Oncology

## 2018-12-03 NOTE — Telephone Encounter (Signed)
GM PAL 7/20 f/u moved to 7/31 as webex. Left message. Schedule mailed.

## 2018-12-17 ENCOUNTER — Other Ambulatory Visit: Payer: Self-pay | Admitting: Oncology

## 2018-12-23 ENCOUNTER — Ambulatory Visit: Payer: Medicare Other | Admitting: Oncology

## 2018-12-23 ENCOUNTER — Other Ambulatory Visit: Payer: Medicare Other

## 2019-01-02 ENCOUNTER — Telehealth: Payer: Self-pay | Admitting: Oncology

## 2019-01-02 NOTE — Telephone Encounter (Signed)
Contacted patient to verify telephone visit for pre reg °

## 2019-01-02 NOTE — Telephone Encounter (Signed)
Called patient regarding upcoming Webex appointment, seen in prior notes that patient has done a Webex visit before so an e-mail has been sent. Left a voicemail, this may need to be a telephone visit.

## 2019-01-02 NOTE — Progress Notes (Signed)
[place]  [phone] [phone]     ID: Kerline Trahan DOB: 1940/11/02  MR#: [id number]  NTZ#:[id number]  Patient Care Team: Javier Glazier, MD as PCP - General (Internal Medicine) Coralie Keens, MD as Consulting Physician (General Surgery) Eppie Gibson, MD as Attending Physician (Radiation Oncology) , Virgie Dad, MD as Consulting Physician (Oncology) Pyrtle, Lajuan Lines, MD as Consulting Physician (Gastroenterology) Mauro Kaufmann, RN as Oncology Nurse Navigator Rockwell Germany, RN as Oncology Nurse Navigator OTHER MD:   CHIEF COMPLAINT: Estrogen receptor positive breast cancer  CURRENT TREATMENT: Anastrozole   I connected with [name] on 09/23/18 at 10:30 AM EDT by video enabled telemedicine visit and verified that I am speaking with the correct person using two identifiers.   I discussed the limitations, risks, security and privacy concerns of performing an evaluation and management service by telemedicine and the availability of in-person appointments. I also discussed with the patient that there may be a patient responsible charge related to this service. The patient expressed understanding and agreed to proceed.   Other persons participating in the visit and their role in the encounter:   - Biomedical scientist, Medical Scribe   Patient's location: home  Provider's location: [facility]   Chief Complaint: estrogen receptor positive breast cancer    INTERVAL HISTORY: Madison Hardin returns today for follow-up and treatment of her estrogen receptor positive breast cancer.   She continues on anastrozole. She goes have some temperature regulation issues, but she doesn't have any sudden hot flashes. She does not notice any vaginal dryness. She pays a fair price for this medication.   Since her last visit, she underwent bone density screening on 11/18/2018. This showed a T-score of -1.4, which is considered osteopenic.   REVIEW OF SYSTEMS: [name] has been practicing social distancing amid the pandemic. For exercise, she has been walking 5-6 miles every morning. She also swims some. She notes that she gets chills following her walk with some numbness of her fingertips. A detailed review of systems was otherwise noncontributory.     HISTORY OF CURRENT ILLNESS: From the original intake note:  Madison Hardin had routine screening mammography on 04/10/2018 showing a possible abnormality in the left breast. She underwent unilateral left diagnostic mammography with tomography and left breast ultrasonography at [facility] on 04/23/2018 showing: Breast Density Category B. In the middle third of the slightly upper and outer left breast is a group of heterogeneous calcifications spanning 2.5 x 2.5 x 1.4 cm on mammography. Ultrasound of the left axilla demonstrates multiple normal-appearing lymph nodes. No suspicious masses or lymph nodes with cortical thickening are identified by ultrasonography.   Accordingly on 04/25/2018 she proceeded to biopsy of the left breast area in question. The pathology from this procedure showed [phone]): Invasive mammary carcinoma, e-cadherin positive, grade 1 Prognostic indicators significant for: estrogen receptor, 100% positive and progesterone receptor, 100% positive, both with strong staining intensity. Proliferation marker Ki67 at 3%. HER2 negative immunohistochemical and morphometric analysis (1+).   The patient's subsequent history is as detailed below.    PAST MEDICAL HISTORY: Past Medical History:  Diagnosis Date  . Arthritis   . Cataract 03/2018   Surgery on both eyes in 03/2018  . Family history of breast cancer   . Family history of pancreatic cancer   . Family history of prostate cancer   . GERD (gastroesophageal reflux disease)   . Hyperlipidemia   . Hypertension   . Malignant  neoplasm of upper-outer quadrant of left breast in female, estrogen receptor  positive ([place]) 05/15/2018  . Pneumonia    as child     PAST SURGICAL HISTORY: Past Surgical History:  Procedure Laterality Date  . ABDOMINAL HYSTERECTOMY    . AUGMENTATION MAMMAPLASTY     Pt had explants  . BREAST EXCISIONAL BIOPSY Left   . BREAST LUMPECTOMY WITH RADIOACTIVE SEED AND SENTINEL LYMPH NODE BIOPSY Left 06/17/2018   Procedure: LEFT BREAST PARTIAL MASTECTOMY WITH RADIOACTIVE SEED AND SENTINEL LYMPH NODE BIOPSY;  Surgeon: Coralie Keens, MD;  Location: [facility];  Service: General;  Laterality: Left;  . BREAST SURGERY    . COLONOSCOPY     last 2010 in [place]  . EYE SURGERY     cataracts  . KIDNEY STONE SURGERY     flushed per pt.  Marland Kitchen POLYPECTOMY     last 2010  . TONSILLECTOMY    . UPPER GASTROINTESTINAL ENDOSCOPY    . UTERINE FIBROID SURGERY  1980     FAMILY HISTORY Family History  Problem Relation Age of Onset  . Breast cancer Mother 41       without treatment  . Prostate cancer Father 66  . Penile cancer Father   . Leukemia Paternal Grandmother   . Pancreatic cancer Paternal Aunt        dx late 43s  . Colon cancer Neg Hx   . Rectal cancer Neg Hx   . Stomach cancer Neg Hx   . Esophageal cancer Neg Hx   . Colon polyps Neg Hx    The patient's father had a history of prostate and penile cancer; he died at age 60. Patients' mother died from breast cancer at age 77.  Apparently she can seal the cancer for a long time before bringing it to medical attention and then refused treatment.  The patient has no siblings. Patient denies anyone in her family having ovarian cancer. The patient's paternal aunt had pancreatic cancer.   GYNECOLOGIC HISTORY:  Menarche: 78 years old Age at first live birth:  [place] P: 0 LMP: No LMP recorded. Patient has had a hysterectomy. 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 She lives  alone in [place] and has no pets. She has no children. She does not attend a church/synagog/mosque.   ADVANCED DIRECTIVES: To be discussed   HEALTH MAINTENANCE: Social History   Tobacco Use  . Smoking status: Former Smoker    Quit date: 06/05/1978    Years since quitting: 40.6  . Smokeless tobacco: Never Used  Substance Use Topics  . Alcohol use: No  . Drug use: No     Colonoscopy: yes, within the last year  PAP: >5 years ago  Bone density: yes, not recent   No Known Allergies  Current Outpatient Medications  Medication Sig Dispense Refill  . anastrozole (ARIMIDEX) 1 MG tablet Take 1 tablet (1 mg total) by mouth daily. 90 tablet 4  . Cholecalciferol (VITAMIN D) 125 MCG (5000 UT) CAPS Take 5,000 Units by mouth daily.     Marland Kitchen MAGNESIUM PO Take 320 mg by mouth daily.     . Multiple Vitamins-Minerals (ICAPS AREDS 2 PO) Take 1 tablet by mouth 2 (two) times daily.     Marland Kitchen omeprazole (PRILOSEC) 10 MG capsule Take 1 capsule (10 mg total) by mouth daily. 90 capsule 3  .  telmisartan (MICARDIS) 40 MG tablet Take 1 tablet (40 mg total) by mouth daily. 90 tablet 3   No current facility-administered medications for this visit.      OBJECTIVE: Older white woman in no acute distress There were no vitals filed for this visit.   There is no height or weight on file to calculate BMI.   Wt Readings from Last 3 Encounters:  07/10/18 215 lb (97.5 kg)  07/01/18 215 lb 8 oz (97.8 kg)  06/17/18 214 lb (97.1 kg)      ECOG FS:1 - Symptomatic but completely ambulatory   Telehealth Visit  LAB RESULTS:  CMP     Component Value Date/Time   NA 139 06/12/2018 1018   NA 142 04/17/2012   K 4.0 06/12/2018 1018   CL 105 06/12/2018 1018   CO2 22 06/12/2018 1018   GLUCOSE 106 (H) 06/12/2018 1018   BUN 19 06/12/2018 1018   BUN 19 04/17/2012   CREATININE 0.84 06/12/2018 1018   CREATININE 0.90 05/15/2018 1535   CREATININE 0.71 09/08/2016 1117   CALCIUM 9.6 06/12/2018 1018   PROT 7.1  05/15/2018 1535   ALBUMIN 3.8 05/15/2018 1535   AST 16 05/15/2018 1535   ALT 15 05/15/2018 1535   ALKPHOS 82 05/15/2018 1535   BILITOT 0.5 05/15/2018 1535   GFRNONAA >60 06/12/2018 1018   GFRNONAA >60 05/15/2018 1535   GFRAA >60 06/12/2018 1018   GFRAA >60 05/15/2018 1535    No results found for: TOTALPROTELP, ALBUMINELP, A1GS, A2GS, BETS, BETA2SER, GAMS, MSPIKE, SPEI  No results found for: KPAFRELGTCHN, LAMBDASER, KAPLAMBRATIO  Lab Results  Component Value Date   WBC 6.9 06/12/2018   NEUTROABS 5.4 05/15/2018   HGB 15.4 (H) 06/12/2018   HCT 48.9 (H) 06/12/2018   MCV 93.1 06/12/2018   PLT 281 06/12/2018    _0 @  No results found for: LABCA2  No components found for: LHTDSK876  No results for input(s): INR in the last 168 hours.  No results found for: LABCA2  No results found for: OTL572  No results found for: IOM355  No results found for: HRC163  No results found for: CA2729  No components found for: HGQUANT  No results found for: CEA1 / No results found for: CEA1   No results found for: AFPTUMOR  No results found for: CHROMOGRNA  No results found for: PSA1  No visits with results within 3 Day(s) from this visit.  Latest known visit with results is:  Hospital Outpatient Visit on 06/12/2018  Component Date Value Ref Range Status  . Sodium 06/12/2018 139  135 - 145 mmol/L Final  . Potassium 06/12/2018 4.0  3.5 - 5.1 mmol/L Final  . Chloride 06/12/2018 105  98 - 111 mmol/L Final  . CO2 06/12/2018 22  22 - 32 mmol/L Final  . Glucose, Bld 06/12/2018 106* 70 - 99 mg/dL Final  . BUN 06/12/2018 19  8 - 23 mg/dL Final  . Creatinine, Ser 06/12/2018 0.84  0.44 - 1.00 mg/dL Final  . Calcium 06/12/2018 9.6  8.9 - 10.3 mg/dL Final  . GFR calc non Af Amer 06/12/2018 >60  >60 mL/min Final  . GFR calc Af Amer 06/12/2018 >60  >60 mL/min Final  . Anion gap 06/12/2018 12  5 - 15 Final   Performed at [facility], [place] [address], [place], [place]  [address]  . WBC 06/12/2018 6.9  4.0 - 10.5 K/uL Final  . RBC 06/12/2018 5.25* 3.87 - 5.11 MIL/uL Final  .  Hemoglobin 06/12/2018 15.4* 12.0 - 15.0 g/dL Final  . HCT 06/12/2018 48.9* 36.0 - 46.0 % Final  . MCV 06/12/2018 93.1  80.0 - 100.0 fL Final  . MCH 06/12/2018 29.3  26.0 - 34.0 pg Final  . MCHC 06/12/2018 31.5  30.0 - 36.0 g/dL Final  . RDW 06/12/2018 13.1  11.5 - 15.5 % Final  . Platelets 06/12/2018 281  150 - 400 K/uL Final  . nRBC 06/12/2018 0.0  0.0 - 0.2 % Final   Performed at [facility], [place] [address], [place], [place] [address]    (this displays the last labs from the last 3 days)  No results found for: TOTALPROTELP, ALBUMINELP, A1GS, A2GS, BETS, BETA2SER, GAMS, MSPIKE, SPEI (this displays SPEP labs)  No results found for: KPAFRELGTCHN, LAMBDASER, KAPLAMBRATIO (kappa/lambda light chains)  No results found for: HGBA, HGBA2QUANT, HGBFQUANT, HGBSQUAN (Hemoglobinopathy evaluation)   No results found for: LDH  No results found for: IRON, TIBC, IRONPCTSAT (Iron and TIBC)  No results found for: FERRITIN  Urinalysis No results found for: COLORURINE, APPEARANCEUR, LABSPEC, PHURINE, GLUCOSEU, HGBUR, BILIRUBINUR, KETONESUR, PROTEINUR, UROBILINOGEN, NITRITE, LEUKOCYTESUR   STUDIES:  Bone density results discussed with the patient  ELIGIBLE FOR AVAILABLE RESEARCH PROTOCOL: no   ASSESSMENT: 78 y.o. [place], [place] woman s/p left breast upper outer quadrant biopsy 04/25/2018 for a clinical T2N0, stage IB invasive ductal carcinoma (E-cadherin positive), grade 1 estrogen and progesterone receptor positive, HER-2 not amplified, with an MIB-1 of 3%  (1) Genetic testing performed through [facility]'s Breast Cancer STAT + Common Hereditary Cancers Panel on 05/09/2018 showed no pathogenic mutations. The STAT Breast cancer panel offered by [facility] includes sequencing and rearrangement analysis for the following 9 genes:  ATM, BRCA1, BRCA2, CDH1, CHEK2, PALB2, PTEN, STK11 and TP53.  The  Common Hereditary Cancers Panel offered by [facility] includes sequencing and/or deletion duplication testing of the following 47 genes: APC, ATM, AXIN2, BARD1, BMPR1A, BRCA1, BRCA2, BRIP1, CDH1, CDKN2A (p14ARF), CDKN2A (p16INK4a), CKD4, CHEK2, CTNNA1, DICER1, EPCAM (Deletion/duplication testing only), GREM1 (promoter region deletion/duplication testing only), KIT, MEN1, MLH1, MSH2, MSH3, MSH6, MUTYH, NBN, NF1, NHTL1, PALB2, PDGFRA, PMS2, POLD1, POLE, PTEN, RAD50, RAD51C, RAD51D, SDHB, SDHC, SDHD, SMAD4, SMARCA4. STK11, TP53, TSC1, TSC2, and VHL.  The following genes were evaluated for sequence changes only: SDHA and HOXB13 c.251G>A variant only.  (a) A variant of uncertain significance (VUS) in a gene called APC was also noted.  (2) status post left lumpectomy and sentinel lymph node sampling 06/19/2018 for a pT1a pN0, stage IA invasive ductal carcinoma, grade 1, with close but negative margins.  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  She has recovered well from her treatments and she is tolerating anastrozole generally well.  She has a good baseline bone density and she will not need denosumab/Prolia as originally considered  She has an excellent exercise program which I commended.  She is on vitamin D supplementation  She will have mammography in November.  I suggested that she might want to postpone that until next year because of the pandemic but she is she says more concerned about the breast cancer than the virus so it will be done in November and then she and I will speak  again late November.  She knows to call for any other issue that may develop before the next visit.  , Virgie Dad, MD  01/03/19 10:46 AM  [facility] [address] [place], [place] [address] Tel. [phone]    Fax. [phone]   I, Jacqualyn Posey am acting as a Education administrator for Chauncey Cruel, MD.   I, Lurline Del MD, have reviewed the above documentation for accuracy and completeness, and I agree with the above.

## 2019-01-03 ENCOUNTER — Inpatient Hospital Stay: Payer: Medicare Other | Attending: Oncology | Admitting: Oncology

## 2019-01-03 DIAGNOSIS — Z170 Estrogen receptor positive status [ER+]: Secondary | ICD-10-CM | POA: Diagnosis not present

## 2019-01-03 DIAGNOSIS — Z79811 Long term (current) use of aromatase inhibitors: Secondary | ICD-10-CM

## 2019-01-03 DIAGNOSIS — C50412 Malignant neoplasm of upper-outer quadrant of left female breast: Secondary | ICD-10-CM

## 2019-01-03 DIAGNOSIS — Z87891 Personal history of nicotine dependence: Secondary | ICD-10-CM | POA: Diagnosis not present

## 2019-01-03 MED ORDER — ANASTROZOLE 1 MG PO TABS: 1.0000 mg | ORAL_TABLET | Freq: Every day | ORAL | 4 refills | Status: DC

## 2019-01-07 ENCOUNTER — Encounter: Payer: Self-pay | Admitting: *Deleted

## 2019-01-08 ENCOUNTER — Telehealth: Payer: Self-pay | Admitting: Adult Health

## 2019-01-08 DIAGNOSIS — G250 Essential tremor: Secondary | ICD-10-CM | POA: Diagnosis not present

## 2019-01-08 DIAGNOSIS — Z0189 Encounter for other specified special examinations: Secondary | ICD-10-CM | POA: Diagnosis not present

## 2019-01-08 DIAGNOSIS — R5383 Other fatigue: Secondary | ICD-10-CM | POA: Diagnosis not present

## 2019-01-08 DIAGNOSIS — E785 Hyperlipidemia, unspecified: Secondary | ICD-10-CM | POA: Diagnosis not present

## 2019-01-08 DIAGNOSIS — I10 Essential (primary) hypertension: Secondary | ICD-10-CM | POA: Diagnosis not present

## 2019-01-08 NOTE — Telephone Encounter (Signed)
Scheduled SCP per 8/4 sch message - unable to reach pt . Pt to get updated schedule next visit.

## 2019-01-13 DIAGNOSIS — R5383 Other fatigue: Secondary | ICD-10-CM | POA: Diagnosis not present

## 2019-01-13 DIAGNOSIS — Z1211 Encounter for screening for malignant neoplasm of colon: Secondary | ICD-10-CM | POA: Diagnosis not present

## 2019-01-13 DIAGNOSIS — Z0000 Encounter for general adult medical examination without abnormal findings: Secondary | ICD-10-CM | POA: Diagnosis not present

## 2019-01-13 DIAGNOSIS — Z23 Encounter for immunization: Secondary | ICD-10-CM | POA: Diagnosis not present

## 2019-01-13 DIAGNOSIS — M79652 Pain in left thigh: Secondary | ICD-10-CM | POA: Diagnosis not present

## 2019-01-13 DIAGNOSIS — M7062 Trochanteric bursitis, left hip: Secondary | ICD-10-CM | POA: Diagnosis not present

## 2019-01-13 DIAGNOSIS — G894 Chronic pain syndrome: Secondary | ICD-10-CM | POA: Diagnosis not present

## 2019-01-14 DIAGNOSIS — Z1211 Encounter for screening for malignant neoplasm of colon: Secondary | ICD-10-CM | POA: Diagnosis not present

## 2019-01-14 DIAGNOSIS — Z0000 Encounter for general adult medical examination without abnormal findings: Secondary | ICD-10-CM | POA: Diagnosis not present

## 2019-04-15 ENCOUNTER — Ambulatory Visit
Admission: RE | Admit: 2019-04-15 | Discharge: 2019-04-15 | Disposition: A | Discharge status: Home / Self Care | Payer: Medicare Other | Source: Ambulatory Visit | Attending: Oncology | Admitting: Oncology

## 2019-04-15 ENCOUNTER — Other Ambulatory Visit: Payer: Self-pay

## 2019-04-15 DIAGNOSIS — C50412 Malignant neoplasm of upper-outer quadrant of left female breast: Secondary | ICD-10-CM

## 2019-04-15 DIAGNOSIS — Z853 Personal history of malignant neoplasm of breast: Secondary | ICD-10-CM | POA: Diagnosis not present

## 2019-04-15 DIAGNOSIS — Z170 Estrogen receptor positive status [ER+]: Secondary | ICD-10-CM

## 2019-04-15 DIAGNOSIS — R928 Other abnormal and inconclusive findings on diagnostic imaging of breast: Secondary | ICD-10-CM | POA: Diagnosis not present

## 2019-04-15 HISTORY — DX: Malignant neoplasm of unspecified site of unspecified female breast: C50.919

## 2019-04-18 ENCOUNTER — Telehealth: Payer: Self-pay | Admitting: Oncology

## 2019-04-18 NOTE — Telephone Encounter (Signed)
Called patient regarding upcoming Webex appointment, patient is notified and e-mail has been sent. °

## 2019-04-20 NOTE — Progress Notes (Signed)
Madison Hardin  [phone] [phone]     ID: [name] DOB: 06-01-1941  MR#: [id number]  IWL#:[id number]  Patient Care Team: Javier Glazier, MD as PCP - General (Internal Medicine) Coralie Keens, MD as Consulting Physician (General Surgery) Eppie Gibson, MD as Attending Physician (Radiation Oncology) Emric Kowalewski, Virgie Dad, MD as Consulting Physician (Oncology) Pyrtle, Lajuan Lines, MD as Consulting Physician (Gastroenterology) Mauro Kaufmann, RN as Oncology Nurse Navigator Rockwell Germany, RN as Oncology Nurse Navigator OTHER MD:   CHIEF COMPLAINT: Estrogen receptor positive breast cancer  CURRENT TREATMENT: Anastrozole   I connected with [name] on 09/23/18 at 10:30 AM EST by video enabled telemedicine visit and verified that I am speaking with the correct person using two identifiers.   I discussed the limitations, risks, security and privacy concerns of performing an evaluation and management service by telemedicine and the availability of in-person appointments. I also discussed with the patient that there may be a patient responsible charge related to this service. The patient expressed understanding and agreed to proceed.   Other persons participating in the visit and their role in the encounter: None  Patient's location: home  Provider's location: [place]   Chief Complaint: estrogen receptor positive breast cancer    INTERVAL HISTORY: Madison Hardin returns today for follow-up of her estrogen receptor positive breast cancer.   She continues on anastrozole.  Hot flashes are not a major issue.  However she has noted significant changes in libido.  This is a concern.  Since her last visit, she underwent bone density screening on 11/18/2018. This showed a T-score of -1.4, which is considered osteopenic.  She also underwent bilateral diagnostic mammography with tomography at [facility] on 04/15/2019  showing: breast density category B; no evidence of malignancy in either breast.    REVIEW OF SYSTEMS: Fritzi is taking appropriate pandemic precautions.  She walks between 3 and 5 miles most days although she did injure ankle a couple of weeks ago and that has slowed her down some.  She did walk 4 miles this morning for example.  She is concerned about her weight which is not going down but it is also not going.  Aside from these issues a detailed review of systems today was stable   HISTORY OF CURRENT ILLNESS: From the original intake note:  [name] had routine screening mammography on 04/10/2018 showing a possible abnormality in the left breast. She underwent unilateral left diagnostic mammography with tomography and left breast ultrasonography at [facility] on 04/23/2018 showing: Breast Density Category B. In the middle third of the slightly upper and outer left breast is a group of heterogeneous calcifications spanning 2.5 x 2.5 x 1.4 cm on mammography. Ultrasound of the left axilla demonstrates multiple normal-appearing lymph nodes. No suspicious masses or lymph nodes with cortical thickening are identified by ultrasonography.   Accordingly on 04/25/2018 she proceeded to biopsy of the left breast area in question. The pathology from this procedure showed [id number]): Invasive mammary carcinoma, e-cadherin positive, grade 1 Prognostic indicators significant for: estrogen receptor, 100% positive and progesterone receptor, 100% positive, both with strong staining intensity. Proliferation marker Ki67 at 3%. HER2 negative immunohistochemical and morphometric analysis (1+).   The patient's subsequent history is as detailed below.    PAST MEDICAL HISTORY: Past Medical History:  Diagnosis Date  . Arthritis   . Breast cancer ([place] [facility])   . Cataract 03/2018   Surgery on both eyes in  03/2018  . Family history of breast cancer   . Family history of pancreatic cancer   . Family history  of prostate cancer   . GERD (gastroesophageal reflux disease)   . Hyperlipidemia   . Hypertension   . Malignant neoplasm of upper-outer quadrant of left breast in female, estrogen receptor positive ([place]) 05/15/2018  . Pneumonia    as child     PAST SURGICAL HISTORY: Past Surgical History:  Procedure Laterality Date  . ABDOMINAL HYSTERECTOMY    . AUGMENTATION MAMMAPLASTY     Pt had explants  . BREAST EXCISIONAL BIOPSY Left   . BREAST LUMPECTOMY    . BREAST LUMPECTOMY WITH RADIOACTIVE SEED AND SENTINEL LYMPH NODE BIOPSY Left 06/17/2018   Procedure: LEFT BREAST PARTIAL MASTECTOMY WITH RADIOACTIVE SEED AND SENTINEL LYMPH NODE BIOPSY;  Surgeon: Coralie Keens, MD;  Location: [facility];  Service: General;  Laterality: Left;  . BREAST SURGERY    . COLONOSCOPY     last 2010 in [place]  . EYE SURGERY     cataracts  . KIDNEY STONE SURGERY     flushed per pt.  Marland Kitchen POLYPECTOMY     last 2010  . TONSILLECTOMY    . UPPER GASTROINTESTINAL ENDOSCOPY    . UTERINE FIBROID SURGERY  1980     FAMILY HISTORY Family History  Problem Relation Age of Onset  . Breast cancer Mother 28       without treatment  . Prostate cancer Father 67  . Penile cancer Father   . Leukemia Paternal Grandmother   . Pancreatic cancer Paternal Aunt        dx late 75s  . Colon cancer Neg Hx   . Rectal cancer Neg Hx   . Stomach cancer Neg Hx   . Esophageal cancer Neg Hx   . Colon polyps Neg Hx    The patient's father had a history of prostate and penile cancer; he died at age 66. Patients' mother died from breast cancer at age 51.  Apparently she can seal the cancer for a long time before bringing it to medical attention and then refused treatment.  The patient has no siblings. Patient denies anyone in her family having ovarian cancer. The patient's paternal aunt had pancreatic cancer.   GYNECOLOGIC HISTORY:  Menarche: 78 years old Age at first live birth:  [place] P: 0 LMP: No LMP recorded. Patient has had a  hysterectomy. 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 She lives alone and has no pets. She has no children. She does not attend a church/synagog/mosque.   ADVANCED DIRECTIVES: To be discussed   HEALTH MAINTENANCE: Social History   Tobacco Use  . Smoking status: Former Smoker    Quit date: 06/05/1978    Years since quitting: 40.9  . Smokeless tobacco: Never Used  Substance Use Topics  . Alcohol use: No  . Drug use: No     Colonoscopy: 01/2018 (Dr. Hilarie Fredrickson)  PAP: >5 years ago  Bone density:11/2018, -1.4   No Known Allergies  Current Outpatient Medications  Medication Sig Dispense Refill  . anastrozole (ARIMIDEX) 1 MG tablet Take 1 tablet (1 mg total) by mouth daily. 90 tablet 4  . Cholecalciferol (VITAMIN D) 125 MCG (5000 UT) CAPS Take 5,000 Units by mouth daily.     Marland Kitchen MAGNESIUM PO Take 320 mg by mouth daily.     Marland Kitchen  Multiple Vitamins-Minerals (ICAPS AREDS 2 PO) Take 1 tablet by mouth 2 (two) times daily.     Marland Kitchen omeprazole (PRILOSEC) 10 MG capsule Take 1 capsule (10 mg total) by mouth daily. 90 capsule 3  . telmisartan (MICARDIS) 40 MG tablet Take 1 tablet (40 mg total) by mouth daily. 90 tablet 3   No current facility-administered medications for this visit.      OBJECTIVE: Older white woman in no acute distress There were no vitals filed for this visit.   There is no height or weight on file to calculate BMI.   Wt Readings from Last 3 Encounters:  07/10/18 215 lb (97.5 kg)  07/01/18 215 lb 8 oz (97.8 kg)  06/17/18 214 lb (97.1 kg)      ECOG FS:1 - Symptomatic but completely ambulatory   Televisit   LAB RESULTS:  CMP     Component Value Date/Time   NA 139 06/12/2018 1018   NA 142 04/17/2012   K 4.0 06/12/2018 1018   CL 105 06/12/2018 1018   CO2 22 06/12/2018 1018   GLUCOSE 106 (H) 06/12/2018 1018   BUN 19  06/12/2018 1018   BUN 19 04/17/2012   CREATININE 0.84 06/12/2018 1018   CREATININE 0.90 05/15/2018 1535   CREATININE 0.71 09/08/2016 1117   CALCIUM 9.6 06/12/2018 1018   PROT 7.1 05/15/2018 1535   ALBUMIN 3.8 05/15/2018 1535   AST 16 05/15/2018 1535   ALT 15 05/15/2018 1535   ALKPHOS 82 05/15/2018 1535   BILITOT 0.5 05/15/2018 1535   GFRNONAA >60 06/12/2018 1018   GFRNONAA >60 05/15/2018 1535   GFRAA >60 06/12/2018 1018   GFRAA >60 05/15/2018 1535    No results found for: TOTALPROTELP, ALBUMINELP, A1GS, A2GS, BETS, BETA2SER, GAMS, MSPIKE, SPEI  No results found for: KPAFRELGTCHN, LAMBDASER, KAPLAMBRATIO  Lab Results  Component Value Date   WBC 6.9 06/12/2018   NEUTROABS 5.4 05/15/2018   HGB 15.4 (H) 06/12/2018   HCT 48.9 (H) 06/12/2018   MCV 93.1 06/12/2018   PLT 281 06/12/2018    _0 @  No results found for: LABCA2  No components found for: GXQJJH417  No results for input(s): INR in the last 168 hours.  No results found for: LABCA2  No results found for: EYC144  No results found for: YJE563  No results found for: JSH702  No results found for: CA2729  No components found for: HGQUANT  No results found for: CEA1 / No results found for: CEA1   No results found for: AFPTUMOR  No results found for: CHROMOGRNA  No results found for: PSA1  No visits with results within 3 Day(s) from this visit.  Latest known visit with results is:  Hospital Outpatient Visit on 06/12/2018  Component Date Value Ref Range Status  . Sodium 06/12/2018 139  135 - 145 mmol/L Final  . Potassium 06/12/2018 4.0  3.5 - 5.1 mmol/L Final  . Chloride 06/12/2018 105  98 - 111 mmol/L Final  . CO2 06/12/2018 22  22 - 32 mmol/L Final  . Glucose, Bld 06/12/2018 106* 70 - 99 mg/dL Final  . BUN 06/12/2018 19  8 - 23 mg/dL Final  . Creatinine, Ser 06/12/2018 0.84  0.44 - 1.00 mg/dL Final  . Calcium 06/12/2018 9.6  8.9 - 10.3 mg/dL Final  . GFR calc non Af Amer 06/12/2018 >60  >60  mL/min Final  . GFR calc Af Amer 06/12/2018 >60  >60 mL/min Final  . Anion gap 06/12/2018 12  5 - 15  Final   Performed at [facility], [place] [address], [place], [place] [address]  . WBC 06/12/2018 6.9  4.0 - 10.5 K/uL Final  . RBC 06/12/2018 5.25* 3.87 - 5.11 MIL/uL Final  . Hemoglobin 06/12/2018 15.4* 12.0 - 15.0 g/dL Final  . HCT 06/12/2018 48.9* 36.0 - 46.0 % Final  . MCV 06/12/2018 93.1  80.0 - 100.0 fL Final  . MCH 06/12/2018 29.3  26.0 - 34.0 pg Final  . MCHC 06/12/2018 31.5  30.0 - 36.0 g/dL Final  . RDW 06/12/2018 13.1  11.5 - 15.5 % Final  . Platelets 06/12/2018 281  150 - 400 K/uL Final  . nRBC 06/12/2018 0.0  0.0 - 0.2 % Final   Performed at [facility], [place] [address] [place], [place] [address]    (this displays the last labs from the last 3 days)  No results found for: TOTALPROTELP, ALBUMINELP, A1GS, A2GS, BETS, BETA2SER, GAMS, MSPIKE, SPEI (this displays SPEP labs)  No results found for: KPAFRELGTCHN, LAMBDASER, KAPLAMBRATIO (kappa/lambda light chains)  No results found for: HGBA, HGBA2QUANT, HGBFQUANT, HGBSQUAN (Hemoglobinopathy evaluation)   No results found for: LDH  No results found for: IRON, TIBC, IRONPCTSAT (Iron and TIBC)  No results found for: FERRITIN  Urinalysis No results found for: COLORURINE, APPEARANCEUR, LABSPEC, PHURINE, GLUCOSEU, HGBUR, BILIRUBINUR, KETONESUR, PROTEINUR, UROBILINOGEN, NITRITE, LEUKOCYTESUR   STUDIES:  Mm Diag Breast Tomo Bilateral  Result Date: 04/15/2019 CLINICAL DATA:  78 year old female with history of left breast cancer post lumpectomy 06/17/2018. EXAM: DIGITAL DIAGNOSTIC BILATERAL MAMMOGRAM WITH CAD AND TOMO COMPARISON:  Previous exam(s). ACR Breast Density Category b: There are scattered areas of fibroglandular density. FINDINGS: No suspicious masses or calcifications are seen in either breast. Lumpectomy changes are identified in the upper central to slightly outer left breast. Spot compression  magnification MLO view of the left breast lumpectomy site was performed. There is no mammographic evidence of locally recurrent malignancy. Mammographic images were processed with CAD. IMPRESSION: New lumpectomy site left breast. There is no mammographic evidence of malignancy. RECOMMENDATION: Diagnostic mammogram is suggested in 1 year. (Code:[id number]-B-01Y) I have discussed the findings and recommendations with the patient. If applicable, a reminder letter will be sent to the patient regarding the next appointment. BI-RADS CATEGORY  2: Benign. Electronically Signed   By: Everlean Alstrom M.D.   On: 04/15/2019 14:26    ELIGIBLE FOR AVAILABLE RESEARCH PROTOCOL: no   ASSESSMENT: 78 y.o. [place], [place] woman s/p left breast upper outer quadrant biopsy 04/25/2018 for a clinical T2N0, stage IB invasive ductal carcinoma (E-cadherin positive), grade 1 estrogen and progesterone receptor positive, HER-2 not amplified, with an MIB-1 of 3%  (1) Genetic testing performed through [facility]'s Breast Cancer STAT + Common Hereditary Cancers Panel on 05/09/2018 showed no pathogenic mutations. The STAT Breast cancer panel offered by [facility] includes sequencing and rearrangement analysis for the following 9 genes:  ATM, BRCA1, BRCA2, CDH1, CHEK2, PALB2, PTEN, STK11 and TP53.  The Common Hereditary Cancers Panel offered by [facility] includes sequencing and/or deletion duplication testing of the following 47 genes: APC, ATM, AXIN2, BARD1, BMPR1A, BRCA1, BRCA2, BRIP1, CDH1, CDKN2A (p14ARF), CDKN2A (p16INK4a), CKD4, CHEK2, CTNNA1, DICER1, EPCAM (Deletion/duplication testing only), GREM1 (promoter region deletion/duplication testing only), KIT, MEN1, MLH1, MSH2, MSH3, MSH6, MUTYH, NBN, NF1, NHTL1, PALB2, PDGFRA, PMS2, POLD1, POLE, PTEN, RAD50, RAD51C, RAD51D, SDHB, SDHC, SDHD, SMAD4, SMARCA4. STK11, TP53, TSC1, TSC2, and VHL.  The following genes were evaluated for sequence changes only: SDHA and HOXB13 c.251G>A variant only.  (a)  A variant of  uncertain significance (VUS) in a gene called APC was also noted.  (2) status post left lumpectomy and sentinel lymph node sampling 06/19/2018 for a pT1a pN0, stage IA invasive ductal carcinoma, grade 1, with close but negative margins.  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  We reviewed her mammography results which are very favorable.  She is tolerating anastrozole well except for libido issues.  I am asking Earlie Counts, her physical therapist, to give her a call and see if she thinks physical therapy would be helpful.  Otherwise Vanity will see me again in 1 year.  She knows to call for an issue that may develop before then.  Adyen Bifulco, Virgie Dad, MD  04/21/19 10:53 AM Medical Oncology and Hematology [facility] [place],  [address] Tel. [phone]    Fax. [phone]    I, Wilburn Mylar, am acting as scribe for Dr. Virgie Dad. Terrace Chiem.  I, Lurline Del MD, have reviewed the above documentation for accuracy and completeness, and I agree with the above.

## 2019-04-21 ENCOUNTER — Inpatient Hospital Stay: Payer: Medicare Other | Attending: Oncology | Admitting: Oncology

## 2019-04-21 ENCOUNTER — Telehealth: Payer: Self-pay | Admitting: Oncology

## 2019-04-21 DIAGNOSIS — Z170 Estrogen receptor positive status [ER+]: Secondary | ICD-10-CM | POA: Diagnosis not present

## 2019-04-21 DIAGNOSIS — C50412 Malignant neoplasm of upper-outer quadrant of left female breast: Secondary | ICD-10-CM

## 2019-04-21 NOTE — Telephone Encounter (Signed)
I left a message regarding schedule  

## 2019-04-25 DIAGNOSIS — M1990 Unspecified osteoarthritis, unspecified site: Secondary | ICD-10-CM | POA: Diagnosis not present

## 2019-04-25 DIAGNOSIS — M7062 Trochanteric bursitis, left hip: Secondary | ICD-10-CM | POA: Diagnosis not present

## 2019-05-03 IMAGING — MR MR BILATERAL BREAST WITHOUT AND WITH CONTRAST
8 of 12 series · 31 of 48 positions shown · IV contrast (10 GADAVIST)
Comparison: Previous exam(s).

CLINICAL DATA: Recent stereotactic guided core biopsy of
calcifications in the UPPER-OUTER QUADRANT of the LEFT breast shows
invasive mammary carcinoma and mammary carcinoma in situ with
calcifications. Based on positive E-cadherin stains, tumor is
favored to be ductal. History of excisional biopsy of the RIGHT
breast.

LABS:  Creatinine was obtained on site at [HOSPITAL] at [REDACTED] [HOSPITAL].
Results: Creatinine 0.9 mg/dL.  BUN is 24.  GFR is 62.
EXAM:
BILATERAL BREAST MRI WITH AND WITHOUT CONTRAST
TECHNIQUE: Multiplanar, multisequence MR images of both breasts were obtained
prior to and following the intravenous administration of 10 ml of
Gadavist

[Series 6: t2_tirm_tra ipat (a-p) · axial · 3.0mm · 0.70mm/px · z∈[-51,+147]mm · 2 of 67 slices shown]
[im 1/67]
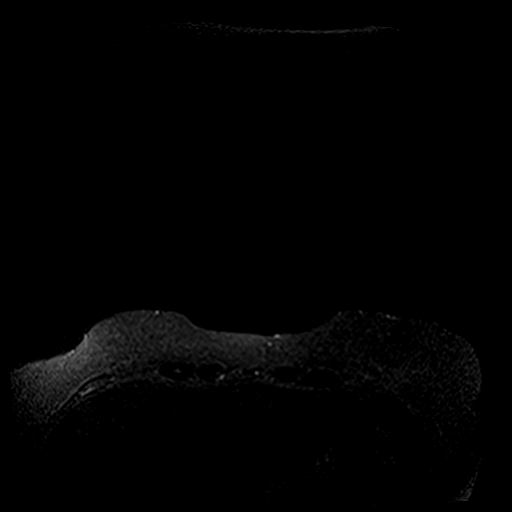
[im 67/67]
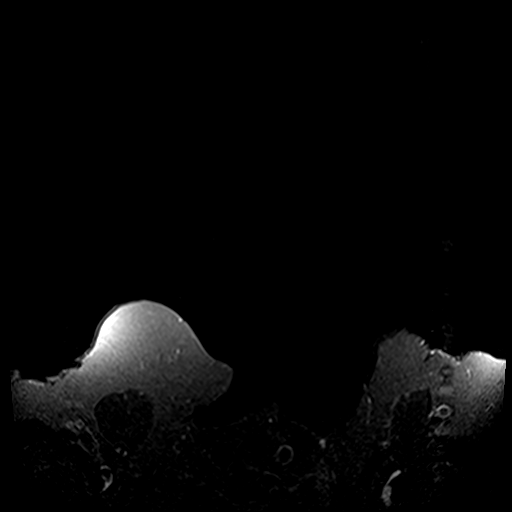

[Series 7: fl3d pre-cm no · axial · non-contrast · 1.2mm · 0.94mm/px · z∈[-57,+153]mm · 6 of 176 slices shown]
[im 1/176]
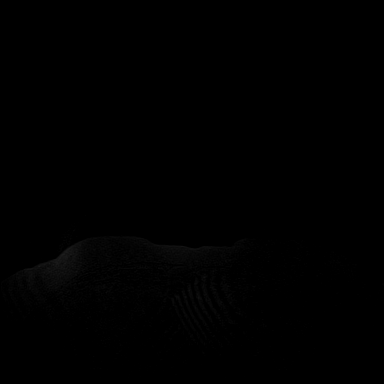
[im 36/176]
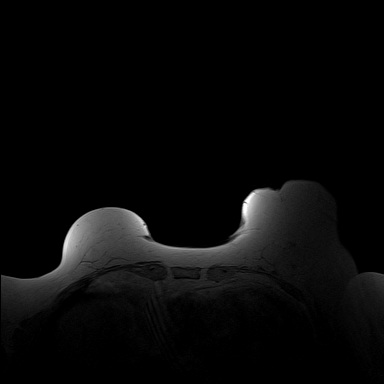
[im 71/176]
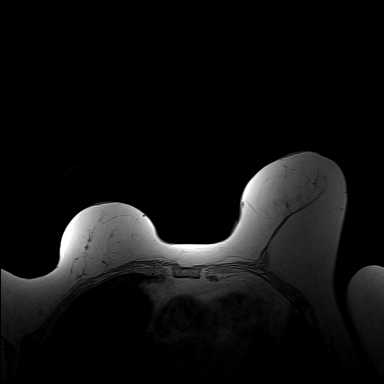
[im 106/176]
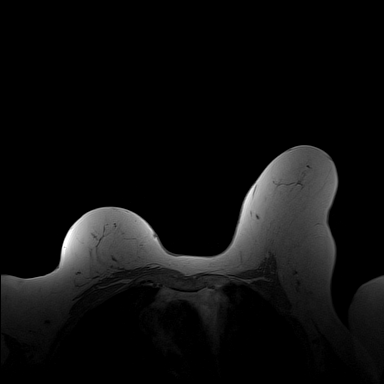
[im 141/176]
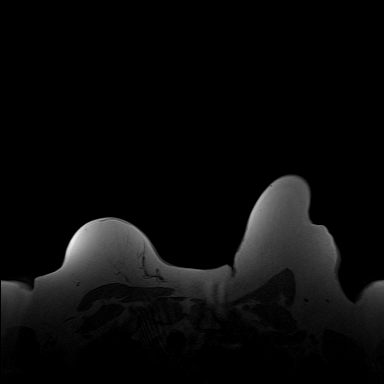
[im 176/176]
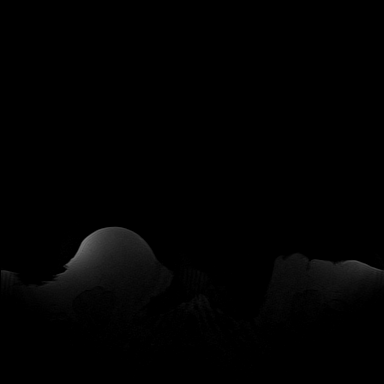

[Series 8: fl3d pre-cm · axial · non-contrast · 1.2mm · 0.94mm/px · z∈[-30,+142]mm · 5 of 144 slices shown]
[im 1/144]
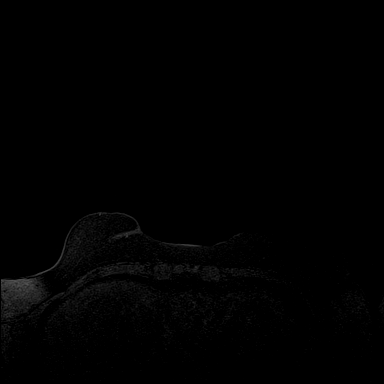
[im 36/144]
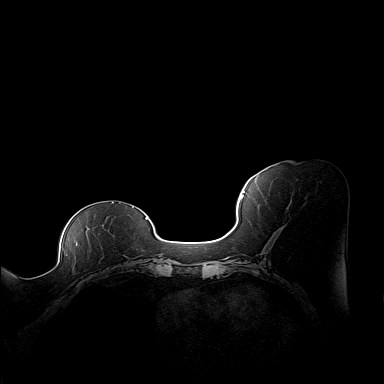
[im 72/144]
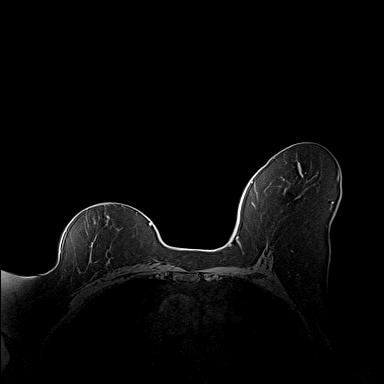
[im 108/144]
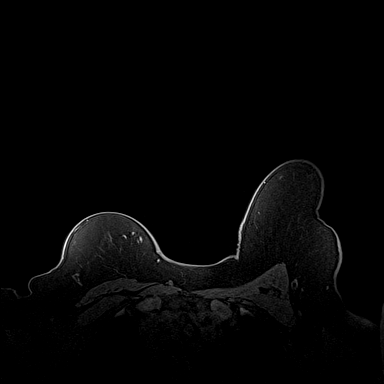
[im 144/144]
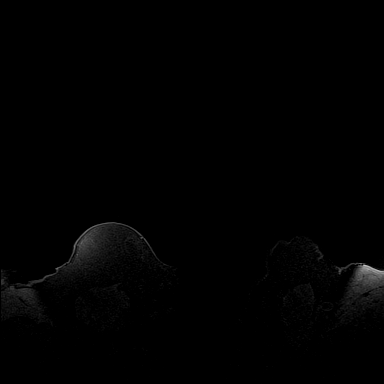

[Series 9: fl3d post-cm 20 · axial · 1.2mm · 0.94mm/px · z∈[-30,+142]mm · 5 of 144 slices shown (1 of 3)]
[im 1/144]
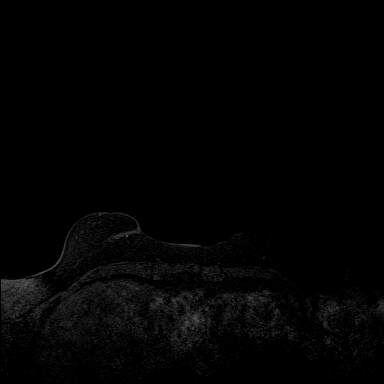
[im 36/144]
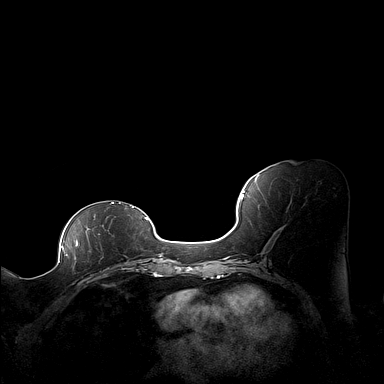
[im 72/144]
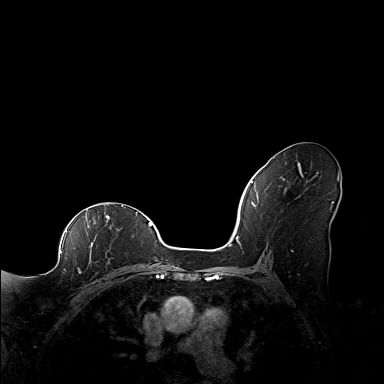
[im 108/144]
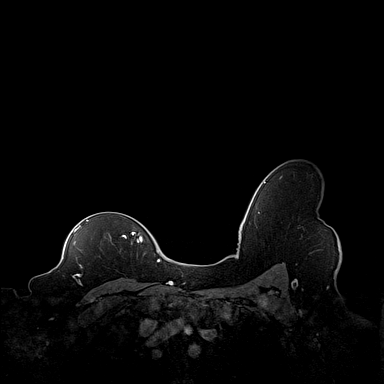
[im 144/144]
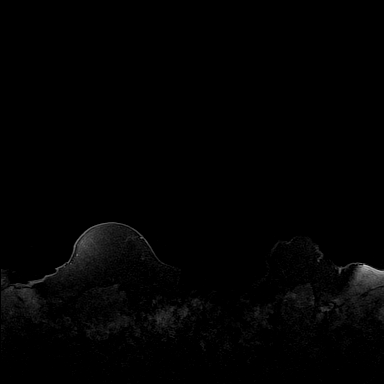

[Series 10: fl3d post-cm 20 · axial · 1.2mm · 0.94mm/px · z∈[-30,+142]mm · 5 of 144 slices shown (2 of 3)]
[im 1/144]
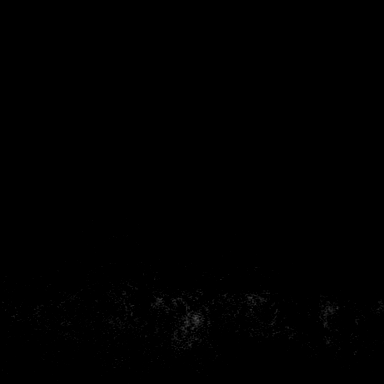
[im 36/144]
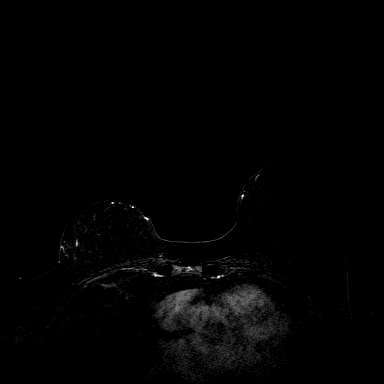
[im 72/144]
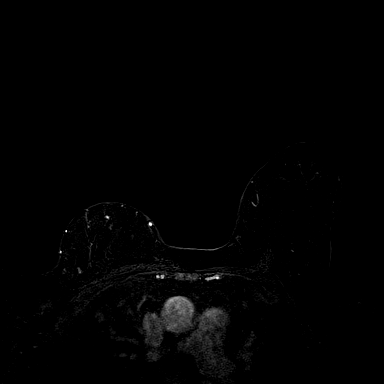
[im 108/144]
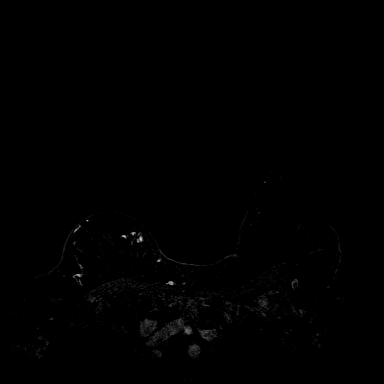
[im 144/144]
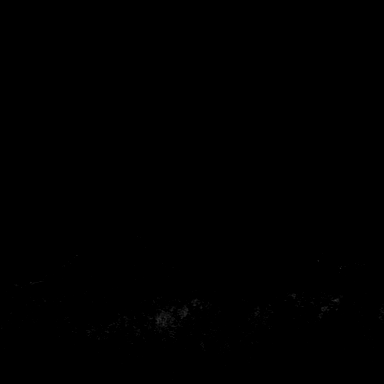

[Series 11: fl3d post-cm 20 · axial · 172.8mm · 0.94mm/px · 1 of 1 slices shown (3 of 3)]
[im 1/1]
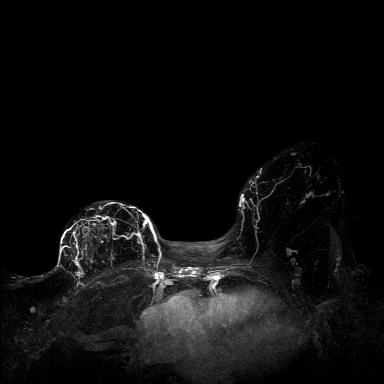

[Series 12: fl3d post-cm 3min · axial · 1.2mm · 0.94mm/px · z∈[-30,+142]mm · 5 of 144 slices shown]
[im 1/144]
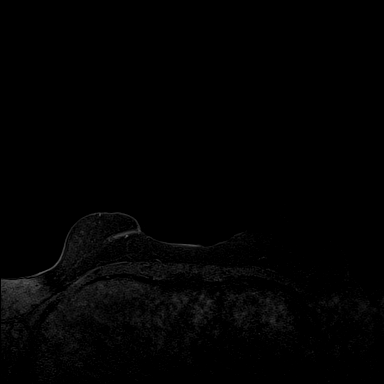
[im 36/144]
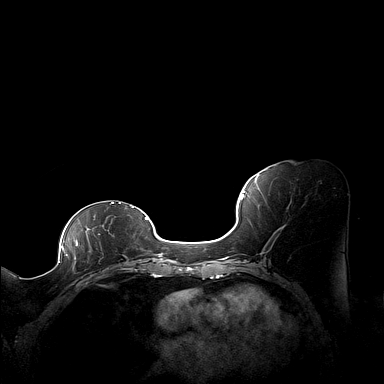
[im 72/144]
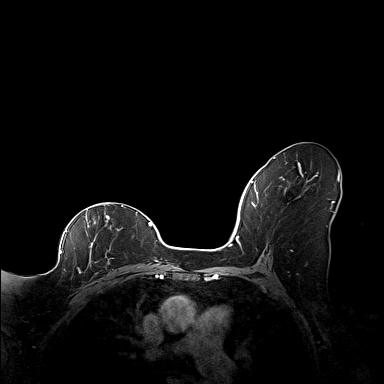
[im 108/144]
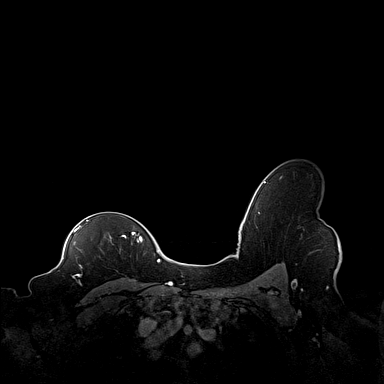
[im 144/144]
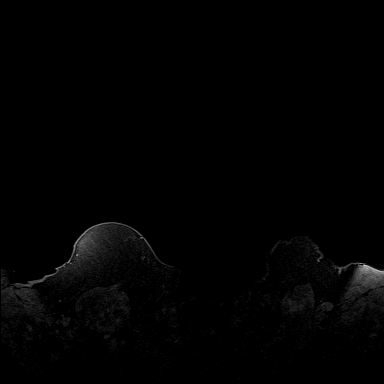

[Series 13: fl3d post-cm 3min_sub · axial · 1.2mm · 0.94mm/px · z∈[-30,+12]mm · 2 of 144 slices shown]
[im 1/144]
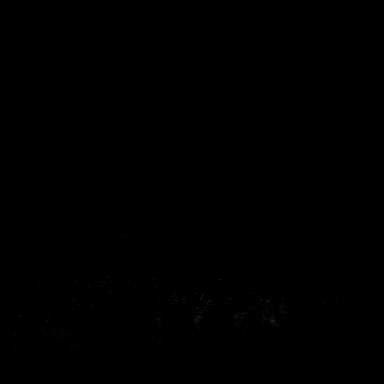
[im 36/144]
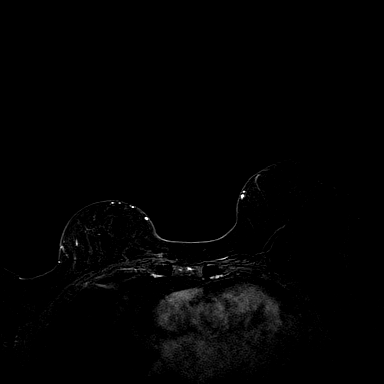

[31 of 48 positions shown; findings below may reference images not displayed]

Three-dimensional MR images were rendered by post-processing of the
original MR data on an independent workstation. The
three-dimensional MR images were interpreted, and findings are
reported in the following complete MRI report for this study. Three
dimensional images were evaluated at the independent [employer]
workstation
FINDINGS: Breast composition: b. Scattered fibroglandular tissue.

Background parenchymal enhancement: Minimal

Right breast: No mass or abnormal enhancement. Postoperative changes
are identified in the RIGHT breast.

Left breast: Within the UPPER-OUTER QUADRANT of the LEFT breast,
there is segmental linear enhancement which measures 3.3 (MEDIAL to
LATERAL) x 0.6 x 0.8 centimeters. The tissue marker clip adjacent to
the MEDIAL aspect of the enhancement. No other areas of enhancement
are identified in the LEFT breast.

Lymph nodes: No abnormal appearing lymph nodes.

Ancillary findings:  None.
IMPRESSION: 1. Linear enhancement in the UPPER-OUTER QUADRANT of the LEFT breast
corresponding to the known malignancy.
2. For localization purposes, the coil shaped clip marks the MEDIAL
aspect of disease and residual calcifications in the LATERAL portion
of the LEFT breast mark the LATERAL aspect of disease and can be
used for localization purposes.
3. RIGHT breast is negative.

RECOMMENDATION:
Bracketed seed localization of the LEFT breast at the time of
lumpectomy, using the coil to mark the MEDIAL extent and the most
LATERAL residual calcifications to localize the LATERAL extent of
disease.

BI-RADS CATEGORY  6: Known biopsy-proven malignancy.

## 2019-07-11 ENCOUNTER — Encounter: Payer: Medicare Other | Admitting: Adult Health

## 2019-07-30 ENCOUNTER — Telehealth: Payer: Self-pay | Admitting: Adult Health

## 2019-07-30 ENCOUNTER — Inpatient Hospital Stay: Payer: Medicare Other | Attending: Adult Health | Admitting: Adult Health

## 2019-07-30 ENCOUNTER — Encounter: Payer: Self-pay | Admitting: Adult Health

## 2019-07-30 ENCOUNTER — Other Ambulatory Visit: Payer: Self-pay

## 2019-07-30 VITALS — BP 125/78 | HR 86 | Temp 98.2°F | Resp 18 | Ht 63.0 in | Wt 189.1 lb

## 2019-07-30 DIAGNOSIS — Z800 Family history of malignant neoplasm of digestive organs: Secondary | ICD-10-CM | POA: Insufficient documentation

## 2019-07-30 DIAGNOSIS — Z170 Estrogen receptor positive status [ER+]: Secondary | ICD-10-CM | POA: Insufficient documentation

## 2019-07-30 DIAGNOSIS — Z87891 Personal history of nicotine dependence: Secondary | ICD-10-CM | POA: Diagnosis not present

## 2019-07-30 DIAGNOSIS — C50412 Malignant neoplasm of upper-outer quadrant of left female breast: Secondary | ICD-10-CM | POA: Insufficient documentation

## 2019-07-30 DIAGNOSIS — E785 Hyperlipidemia, unspecified: Secondary | ICD-10-CM | POA: Insufficient documentation

## 2019-07-30 DIAGNOSIS — Z8049 Family history of malignant neoplasm of other genital organs: Secondary | ICD-10-CM | POA: Diagnosis not present

## 2019-07-30 DIAGNOSIS — M8580 Other specified disorders of bone density and structure, unspecified site: Secondary | ICD-10-CM | POA: Insufficient documentation

## 2019-07-30 DIAGNOSIS — Z79811 Long term (current) use of aromatase inhibitors: Secondary | ICD-10-CM | POA: Insufficient documentation

## 2019-07-30 DIAGNOSIS — Z90710 Acquired absence of both cervix and uterus: Secondary | ICD-10-CM | POA: Insufficient documentation

## 2019-07-30 DIAGNOSIS — I10 Essential (primary) hypertension: Secondary | ICD-10-CM | POA: Diagnosis not present

## 2019-07-30 DIAGNOSIS — Z803 Family history of malignant neoplasm of breast: Secondary | ICD-10-CM | POA: Insufficient documentation

## 2019-07-30 NOTE — Progress Notes (Signed)
SURVIVORSHIP VISIT:   BRIEF ONCOLOGIC HISTORY:  Oncology History  Malignant neoplasm of upper-outer quadrant of left breast in female, estrogen receptor positive ([place])  05/09/2018 Genetic Testing   Genetic testing performed through [facility]'s Breast Cancer STAT + Common Hereditary Cancers Panel on 05/09/2018 showed no pathogenic mutations. The STAT Breast cancer panel offered by [facility] includes sequencing and rearrangement analysis for the following 9 genes:  ATM, BRCA1, BRCA2, CDH1, CHEK2, PALB2, PTEN, STK11 and TP53.  The Common Hereditary Cancers Panel offered by [facility] includes sequencing and/or deletion duplication testing of the following 47 genes: APC, ATM, AXIN2, BARD1, BMPR1A, BRCA1, BRCA2, BRIP1, CDH1, CDKN2A (p14ARF), CDKN2A (p16INK4a), CKD4, CHEK2, CTNNA1, DICER1, EPCAM (Deletion/duplication testing only), GREM1 (promoter region deletion/duplication testing only), KIT, MEN1, MLH1, MSH2, MSH3, MSH6, MUTYH, NBN, NF1, NHTL1, PALB2, PDGFRA, PMS2, POLD1, POLE, PTEN, RAD50, RAD51C, RAD51D, SDHB, SDHC, SDHD, SMAD4, SMARCA4. STK11, TP53, TSC1, TSC2, and VHL.  The following genes were evaluated for sequence changes only: SDHA and HOXB13 c.251G>A variant only.   05/15/2018 Initial Diagnosis   Malignant neoplasm of upper-outer quadrant of left breast in female, estrogen receptor positive ([place])   06/2018 - 06/2023 Anti-estrogen oral therapy   Anastrozole   06/17/2018 Surgery   Left lumpectomy Ninfa Linden) [phone]) revealed IDC, grade 1, ER positive, PR positive, HER2 negative, Ki67 of 3%. Close but negative margins. Surgery also revealed DCIS, grade 2. 2 lymph nodes negative for carcinoma.     INTERVAL HISTORY:  Ms. Fennewald to review her survivorship care plan detailing her treatment course for breast cancer, as well as monitoring long-term side effects of that treatment, education regarding health maintenance, screening, and overall wellness and health promotion.     Overall, Ms. Califano reports  feeling quite well.  She underwent a bilateral breast diagnostic mammogram in 04/2019 that demonstrated no evidence of malignancy and breast density category B.  She also underwent a bone density test on 11/18/2018 that showed osteopnia with a t score of -1.4 in the left femur.  Delanie is taking Anastrozole daily and is tolerating it well.  She has mild arthralgias, but notes they were present prior to starting her anti estrogen therapy.    REVIEW OF SYSTEMS:  Review of Systems  Constitutional: Negative for appetite change, chills, fatigue, fever and unexpected weight change.  HENT:   Negative for hearing loss, lump/mass and sore throat.   Eyes: Negative for eye problems and icterus.  Respiratory: Negative for chest tightness, cough and shortness of breath.   Cardiovascular: Negative for chest pain, leg swelling and palpitations.  Gastrointestinal: Negative for abdominal distention, abdominal pain, constipation, diarrhea, nausea and vomiting.  Endocrine: Negative for hot flashes.  Genitourinary: Negative for difficulty urinating.   Musculoskeletal: Negative for arthralgias.  Skin: Negative for itching and rash.  Neurological: Negative for dizziness, extremity weakness, headaches and numbness.  Hematological: Negative for adenopathy. Does not bruise/bleed easily.  Psychiatric/Behavioral: Negative for depression. The patient is not nervous/anxious.   Breast: Denies any new nodularity, masses, tenderness, nipple changes, or nipple discharge.      ONCOLOGY TREATMENT TEAM:  1. Surgeon:  Dr. Ninfa Linden at [facility] 2. Medical Oncologist: Dr. Jana Hakim  3. Radiation Oncologist: Dr. Isidore Moos    PAST MEDICAL/SURGICAL HISTORY:  Past Medical History:  Diagnosis Date  . Arthritis   . Breast cancer ([place])   . Cataract 03/2018   Surgery on both eyes in 03/2018  . Family history of breast cancer   . Family history of pancreatic cancer   . Family  history of prostate cancer   . GERD  (gastroesophageal reflux disease)   . Hyperlipidemia   . Hypertension   . Malignant neoplasm of upper-outer quadrant of left breast in female, estrogen receptor positive ([place]) 05/15/2018  . Pneumonia    as child   Past Surgical History:  Procedure Laterality Date  . ABDOMINAL HYSTERECTOMY    . AUGMENTATION MAMMAPLASTY     Pt had explants  . BREAST EXCISIONAL BIOPSY Left   . BREAST LUMPECTOMY    . BREAST LUMPECTOMY WITH RADIOACTIVE SEED AND SENTINEL LYMPH NODE BIOPSY Left 06/17/2018   Procedure: LEFT BREAST PARTIAL MASTECTOMY WITH RADIOACTIVE SEED AND SENTINEL LYMPH NODE BIOPSY;  Surgeon: Coralie Keens, MD;  Location: [facility];  Service: General;  Laterality: Left;  . BREAST SURGERY    . COLONOSCOPY     last 2010 in [place]  . EYE SURGERY     cataracts  . KIDNEY STONE SURGERY     flushed per pt.  Marland Kitchen POLYPECTOMY     last 2010  . TONSILLECTOMY    . UPPER GASTROINTESTINAL ENDOSCOPY    . UTERINE FIBROID SURGERY  1980     ALLERGIES:  No Known Allergies   CURRENT MEDICATIONS:  Outpatient Encounter Medications as of 07/30/2019  Medication Sig  . rosuvastatin (CRESTOR) 20 MG tablet Take 20 mg by mouth 2 (two) times a week.  Marland Kitchen anastrozole (ARIMIDEX) 1 MG tablet Take 1 tablet (1 mg total) by mouth daily.  . Cholecalciferol (VITAMIN D) 125 MCG (5000 UT) CAPS Take 5,000 Units by mouth daily.   Marland Kitchen MAGNESIUM PO Take 320 mg by mouth daily.   . Multiple Vitamins-Minerals (ICAPS AREDS 2 PO) Take 1 tablet by mouth 2 (two) times daily.   Marland Kitchen omeprazole (PRILOSEC) 10 MG capsule Take 1 capsule (10 mg total) by mouth daily.  Marland Kitchen telmisartan (MICARDIS) 40 MG tablet Take 1 tablet (40 mg total) by mouth daily.   No facility-administered encounter medications on file as of 07/30/2019.     ONCOLOGIC FAMILY HISTORY:  Family History  Problem Relation Age of Onset  . Breast cancer Mother 67       without treatment  . Prostate cancer Father 25  . Penile cancer Father   . Leukemia Paternal  Grandmother   . Pancreatic cancer Paternal Aunt        dx late 82s  . Colon cancer Neg Hx   . Rectal cancer Neg Hx   . Stomach cancer Neg Hx   . Esophageal cancer Neg Hx   . Colon polyps Neg Hx      GENETIC COUNSELING/TESTING: See above  SOCIAL HISTORY:  Social History   Socioeconomic History  . Marital status: Single    Spouse name: Not on file  . Number of children: 0  . Years of education: Not on file  . Highest education level: Not on file  Occupational History  . Occupation: Retired  Tobacco Use  . Smoking status: Former Smoker    Quit date: 06/05/1978    Years since quitting: 41.1  . Smokeless tobacco: Never Used  Substance and Sexual Activity  . Alcohol use: No  . Drug use: No  . Sexual activity: Not on file  Other Topics Concern  . Not on file  Social History Narrative  . Not on file   Social Determinants of Health   Financial Resource Strain:   . Difficulty of Paying Living Expenses: Not on file  Food Insecurity:   . Worried About Running  Out of Food in the Last Year: Not on file  . Ran Out of Food in the Last Year: Not on file  Transportation Needs:   . Lack of Transportation (Medical): Not on file  . Lack of Transportation (Non-Medical): Not on file  Physical Activity:   . Days of Exercise per Week: Not on file  . Minutes of Exercise per Session: Not on file  Stress:   . Feeling of Stress : Not on file  Social Connections:   . Frequency of Communication with Friends and Family: Not on file  . Frequency of Social Gatherings with Friends and Family: Not on file  . Attends Religious Services: Not on file  . Active Member of Clubs or Organizations: Not on file  . Attends Archivist Meetings: Not on file  . Marital Status: Not on file  Intimate Partner Violence:   . Fear of Current or Ex-Partner: Not on file  . Emotionally Abused: Not on file  . Physically Abused: Not on file  . Sexually Abused: Not on file     OBSERVATIONS/OBJECTIVE:   BP 125/78 (BP Location: Left Arm, Patient Position: Sitting)   Pulse 86   Temp 98.2 F (36.8 C) (Temporal)   Resp 18   Ht '5\' 3"'$  (1.6 m)   Wt 189 lb 1.6 oz (85.8 kg)   SpO2 100%   BMI 33.50 kg/m  GENERAL: Patient is a well appearing female in no acute distress HEENT:  Sclerae anicteric.  Oropharynx clear and moist. No ulcerations or evidence of oropharyngeal candidiasis. Neck is supple.  NODES:  No cervical, supraclavicular, or axillary lymphadenopathy palpated.  BREAST EXAM:  Right breast benign, left breast s/p lumpectomy, no sign of local recurrence LUNGS:  Clear to auscultation bilaterally.  No wheezes or rhonchi. HEART:  Regular rate and rhythm. No murmur appreciated. ABDOMEN:  Soft, nontender.  Positive, normoactive bowel sounds. No organomegaly palpated. MSK:  No focal spinal tenderness to palpation. Full range of motion bilaterally in the upper extremities. EXTREMITIES:  No peripheral edema.   SKIN:  Clear with no obvious rashes or skin changes. No nail dyscrasia. NEURO:  Nonfocal. Well oriented.  Appropriate affect.   LABORATORY DATA:  None for this visit.  DIAGNOSTIC IMAGING:  EXAM: DUAL X-RAY ABSORPTIOMETRY (DXA) FOR BONE MINERAL DENSITY  IMPRESSION: Referring Physician:  Chauncey Cruel Your patient completed a BMD test using Lunar IDXA DXA system ( analysis version: 16 ) manufactured by [employer]. Technologist: AW PATIENT: Name: Shauniece, Kwan Patient ID: [id number] Birth Date: 10-03-1940 Height: 62.0 in. Sex: Female Measured: 11/18/2018 Weight: 187.8 lbs.  Indications: Advanced Age, Anastrazole, Breast Cancer History, Caucasian, Estrogen Deficient, History of Osteopenia, Hysterectomy, Omeprazole, Postmenopausal Fractures: None Treatments: Hormone Therapy For Cancer, Vitamin D (E933.5)  ASSESSMENT: The BMD measured at Femur Neck Left is 0.837 g/cm2 with a T-score of -1.4. This patient is considered osteopenic according to [place] [facility]) criteria.  The scan quality is good. L-3 and L-4 were excluded due to degenerative changes.  Site Region Measured Date Measured Age YA BMD Significant CHANGE T-score  DualFemur Neck Left  11/18/2018    77.3         -1.4    0.837 g/cm2  AP Spine  L1-L2      11/18/2018    77.3         -0.3    1.124 g/cm2  DualFemur Total Mean 11/18/2018    77.3         -  0.4    0.958 g/cm2  World Health Organization [facility]) criteria for post-menopausal, Caucasian Women: Normal       T-score at or above -1 SD Osteopenia   T-score between -1 and -2.5 SD Osteoporosis T-score at or below -2.5 SD  RECOMMENDATION: 1. All patients should optimize calcium and vitamin D intake. 2. Consider FDA approved medical therapies in postmenopausal women and men aged 26 years and older, based on the following: a. A hip or vertebral (clinical or morphometric) fracture b. T- score < or = -2.5 at the femoral neck or spine after appropriate evaluation to exclude secondary causes c. Low bone mass (T-score between -1.0 and -2.5 at the femoral neck or spine) and a 10 year probability of a hip fracture > or = 3% or a 10 year probability of a major osteoporosis-related fracture > or = 20% based on the [place]-adapted WHO algorithm d. Clinician judgment and/or patient preferences may indicate treatment for people with 10-year fracture probabilities above or below these levels  FOLLOW-UP: People with diagnosed cases of osteoporosis or at high risk for fracture should have regular bone mineral density tests. For patients eligible for Medicare, routine testing is allowed once every 2 years. The testing frequency can be increased to one year for patients who have rapidly progressing disease, those who are receiving or discontinuing medical therapy to restore bone mass, or have additional risk factors.  FRAX* 10-year Probability of Fracture Based on femoral neck BMD: DualFemur (Left)  Major Osteoporotic  Fracture: 11.4% Hip Fracture:                2.3%  Population:                  [place] (Caucasian) Risk Factors:                None  *FRAX is a Materials engineer of the [employer] for Metabolic Bone Disease, a World Pharmacologist (WHO) [facility].  ASSESSMENT: The probability of a major osteoporotic fracture is 11.4 % within the next ten years.  The probability of a hip fracture is 2.3 % within the next ten years.   Electronically Signed   By: Earle Gell M.D.   On: 11/18/2018 13:18   CLINICAL DATA:  79 year old female with history of left breast cancer post lumpectomy 06/17/2018.  EXAM: DIGITAL DIAGNOSTIC BILATERAL MAMMOGRAM WITH CAD AND TOMO  COMPARISON:  Previous exam(s).  ACR Breast Density Category b: There are scattered areas of fibroglandular density.  FINDINGS: No suspicious masses or calcifications are seen in either breast. Lumpectomy changes are identified in the upper central to slightly outer left breast. Spot compression magnification MLO view of the left breast lumpectomy site was performed. There is no mammographic evidence of locally recurrent malignancy.  Mammographic images were processed with CAD.  IMPRESSION: New lumpectomy site left breast. There is no mammographic evidence of malignancy.  RECOMMENDATION: Diagnostic mammogram is suggested in 1 year. (Code:DM-B-01Y)  I have discussed the findings and recommendations with the patient. If applicable, a reminder letter will be sent to the patient regarding the next appointment.  BI-RADS CATEGORY  2: Benign.   Electronically Signed   By: Everlean Alstrom M.D.   On: 04/15/2019 14:26  ASSESSMENT AND PLAN:  Ms.. Takacs is a pleasant 79 y.o. female with Stage IA left breast invasive ductal carcinoma, ER+/PR+/HER2-, diagnosed in 05/2018, treated with lumpectomy and anti-estrogen therapy with Anastrozole beginning in 06/2018.  She presents  to  the Survivorship Clinic for our initial meeting and routine follow-up post-completion of treatment for breast cancer.    1. Stage IA left breast cancer:  Ms. Kalka is continuing to recover from definitive treatment for breast cancer. She will follow-up with her medical oncologist, Dr. Jana Hakim in 04/2020 with history and physical exam per surveillance protocol.  She will continue her anti-estrogen therapy with Anastrozole. Thus far, she is tolerating the Anastrozole well, with minimal side effects. Her mammogram is due 04/2019; orders placed today.  Her breast density is category B. Today, a comprehensive survivorship care plan and treatment summary was reviewed with the patient today detailing her breast cancer diagnosis, treatment course, potential late/long-term effects of treatment, appropriate follow-up care with recommendations for the future, and patient education resources.  A copy of this summary, along with a letter will be sent to the patient's primary care provider via mail/fax/In Basket message after today's visit.    2. Bone health:  Given Ms. Mears's age/history of breast cancer and her current treatment regimen including anti-estrogen therapy with Anastrozole, she is at risk for bone demineralization.  Her last DEXA scan was 11/2018, which showed osteopenia.  I recommended she repeat bone density in 04/2021 at the same time as her mammogram.   She was given education on specific activities to promote bone health.  3. Cancer screening:  Due to Ms. Landrus's history and her age, she should receive screening for skin cancers, colon cancer, and gynecologic cancers.  The information and recommendations are listed on the patient's comprehensive care plan/treatment summary and were reviewed in detail with the patient.    4. Health maintenance and wellness promotion: Ms. Butters was encouraged to consume 5-7 servings of fruits and vegetables per day. We reviewed the "Nutrition Rainbow" handout, as well as  the handout "Take Control of Your Health and Reduce Your Cancer Risk" from the [facility].  She was also encouraged to engage in moderate to vigorous exercise for 30 minutes per day most days of the week. We discussed the [employer] fitness program, which is designed for cancer survivors to help them become more physically fit after cancer treatments.  She was instructed to limit her alcohol consumption and continue to abstain from tobacco use.     5. Support services/counseling: It is not uncommon for this period of the patient's cancer care trajectory to be one of many emotions and stressors.  We discussed how this can be increasingly difficult during the times of quarantine and social distancing due to the COVID-19 pandemic.   She was given information regarding our available services and encouraged to contact me with any questions or for help enrolling in any of our support group/programs.    Follow up instructions:    -Return to cancer center 04/2020 for f/u with Dr. Jana Hakim  -Mammogram due in 04/2020 -Follow up with me in 10/2020 -She was recommended to continue with the appropriate pandemic precautions.  She knows to call for any questions that may arise between now and her next appointment.  We are happy to see her sooner if needed.  Total encounter time: 45 minutes  Wilber Bihari, NP 07/30/19 9:33 AM Medical Oncology and Hematology [facility] [place], [place] [address] Tel. [phone]    Fax. [phone]  *Total Encounter Time as defined by the Centers for Medicare and Medicaid Services includes, in addition to the face-to-face time of a patient visit (documented in the note above) non-face-to-face time: obtaining and  reviewing outside history, ordering and reviewing medications, tests or procedures, care coordination (communications with other health care professionals or caregivers) and documentation in the medical record.

## 2019-07-30 NOTE — Telephone Encounter (Signed)
I left a message regarding schedule  

## 2020-01-16 ENCOUNTER — Other Ambulatory Visit: Payer: Self-pay | Admitting: Oncology

## 2020-01-23 ENCOUNTER — Other Ambulatory Visit: Payer: Self-pay | Admitting: Oncology

## 2020-01-23 DIAGNOSIS — Z9889 Other specified postprocedural states: Secondary | ICD-10-CM

## 2020-04-15 ENCOUNTER — Ambulatory Visit
Admission: RE | Admit: 2020-04-15 | Discharge: 2020-04-15 | Disposition: A | Discharge status: Home / Self Care | Payer: Medicare Other | Source: Ambulatory Visit | Attending: Oncology | Admitting: Oncology

## 2020-04-15 ENCOUNTER — Other Ambulatory Visit: Payer: Self-pay

## 2020-04-15 DIAGNOSIS — Z9889 Other specified postprocedural states: Secondary | ICD-10-CM

## 2020-04-19 NOTE — Progress Notes (Signed)
[facility]  [phone] Fax:([phone]) [phone]     ID: Madison Hardin DOB: [birth year]  MR#: [id number]  UUV#:[id number]  Patient Care Team: Javier Glazier, MD as PCP - General (Internal Medicine) Coralie Keens, MD as Consulting Physician (General Surgery) Eppie Gibson, MD as Attending Physician (Radiation Oncology) Eyoel Throgmorton, Virgie Dad, MD as Consulting Physician (Oncology) Pyrtle, Lajuan Lines, MD as Consulting Physician (Gastroenterology) OTHER MD:   CHIEF COMPLAINT: Estrogen receptor positive breast cancer  CURRENT TREATMENT: Anastrozole   INTERVAL HISTORY: Madison Hardin returns today for follow-up of her estrogen receptor positive breast cancer.   She continues on anastrozole.  Hot flashes are not a major issue.  Vaginal dryness is "not a concern".  Her most recent bone density screening on 11/18/2018 showed a T-score of -1.4, which is considered osteopenic.  Since her last visit, she underwent bilateral diagnostic mammography with tomography at [facility] on 04/15/2020 showing: breast density category B; no evidence of malignancy in either breast.   REVIEW OF SYSTEMS: Nicolena tells me because of her knee she is not able to walk like she used to.  She used to do several miles a day.  In fact she cannot walk at all.  She cannot even do exercises in a pool.  She has seen Ortho for this but she has not been offered surgery.  She has gained a lot of weight as a result.  A detailed review of systems today was otherwise stable.   COVID 19 VACCINATION STATUS:    HISTORY OF CURRENT ILLNESS: From the original intake note:  Madison Hardin had routine screening mammography on 04/10/2018 showing a possible abnormality in the left breast. She underwent unilateral left diagnostic mammography with tomography and left breast ultrasonography at [facility] on 04/23/2018 showing: Breast Density Category B. In the middle third of the slightly upper and outer  left breast is a group of heterogeneous calcifications spanning 2.5 x 2.5 x 1.4 cm on mammography. Ultrasound of the left axilla demonstrates multiple normal-appearing lymph nodes. No suspicious masses or lymph nodes with cortical thickening are identified by ultrasonography.   Accordingly on 04/25/2018 she proceeded to biopsy of the left breast area in question. The pathology from this procedure showed [id number]): Invasive mammary carcinoma, e-cadherin positive, grade 1 Prognostic indicators significant for: estrogen receptor, 100% positive and progesterone receptor, 100% positive, both with strong staining intensity. Proliferation marker Ki67 at 3%. HER2 negative immunohistochemical and morphometric analysis (1+).   The patient's subsequent history is as detailed below.    PAST MEDICAL HISTORY: Past Medical History:  Diagnosis Date  . Arthritis   . Breast cancer ([place])   . Cataract 03/2018   Surgery on both eyes in 03/2018  . Family history of breast cancer   . Family history of pancreatic cancer   . Family history of prostate cancer   . GERD (gastroesophageal reflux disease)   . Hyperlipidemia   . Hypertension   . Malignant neoplasm of upper-outer quadrant of left breast in female, estrogen receptor positive ([place]) 05/15/2018  . Pneumonia    as child     PAST SURGICAL HISTORY: Past Surgical History:  Procedure Laterality Date  . ABDOMINAL HYSTERECTOMY    . AUGMENTATION MAMMAPLASTY     Pt had explants  . BREAST EXCISIONAL BIOPSY Left   . BREAST LUMPECTOMY    . BREAST LUMPECTOMY WITH RADIOACTIVE SEED AND SENTINEL LYMPH NODE BIOPSY Left 06/17/2018   Procedure: LEFT BREAST PARTIAL MASTECTOMY WITH RADIOACTIVE SEED AND  SENTINEL LYMPH NODE BIOPSY;  Surgeon: Coralie Keens, MD;  Location: [facility];  Service: General;  Laterality: Left;  . BREAST SURGERY    . COLONOSCOPY     last 2010 in [place]  . EYE SURGERY     cataracts  . KIDNEY STONE SURGERY     flushed per pt.  Marland Kitchen  POLYPECTOMY     last 2010  . TONSILLECTOMY    . UPPER GASTROINTESTINAL ENDOSCOPY    . UTERINE FIBROID SURGERY  1980     FAMILY HISTORY Family History  Problem Relation Age of Onset  . Breast cancer Mother 89       without treatment  . Prostate cancer Father 91  . Penile cancer Father   . Leukemia Paternal Grandmother   . Pancreatic cancer Paternal Aunt        dx late 65s  . Colon cancer Neg Hx   . Rectal cancer Neg Hx   . Stomach cancer Neg Hx   . Esophageal cancer Neg Hx   . Colon polyps Neg Hx    The patient's father had a history of prostate and penile cancer; he died at age 52. Patients' mother died from breast cancer at age 70.  Apparently she can seal the cancer for a long time before bringing it to medical attention and then refused treatment.  The patient has no siblings. Patient denies anyone in her family having ovarian cancer. The patient's paternal aunt had pancreatic cancer.   GYNECOLOGIC HISTORY:  Menarche: 79 years old Age at first live birth:  [place] P: 0 LMP: No LMP recorded. Patient has had a hysterectomy. 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 She lives alone (independent living at [facility]) and has no pets. She has no children. She does not attend a church/synagog/mosque.   ADVANCED DIRECTIVES: To be discussed   HEALTH MAINTENANCE: Social History   Tobacco Use  . Smoking status: Former Smoker    Quit date: 06/05/1978    Years since quitting: 41.9  . Smokeless tobacco: Never Used  Vaping Use  . Vaping Use: Never used  Substance Use Topics  . Alcohol use: No  . Drug use: No     Colonoscopy: 01/2018 (Dr. Hilarie Fredrickson)  PAP: >5 years ago  Bone density:11/2018, -1.4   No Known Allergies  Current Outpatient Medications  Medication Sig Dispense Refill  . anastrozole (ARIMIDEX) 1 MG tablet TAKE 1  TABLET DAILY 90 tablet 3  . Cholecalciferol (VITAMIN D) 125 MCG (5000 UT) CAPS Take 5,000 Units by mouth daily.     Marland Kitchen MAGNESIUM PO Take 320 mg by mouth daily.     . Multiple Vitamins-Minerals (ICAPS AREDS 2 PO) Take 1 tablet by mouth 2 (two) times daily.     Marland Kitchen omeprazole (PRILOSEC) 10 MG capsule Take 1 capsule (10 mg total) by mouth daily. 90 capsule 3  . rosuvastatin (CRESTOR) 20 MG tablet Take 20 mg by mouth 2 (two) times a week.    . telmisartan (MICARDIS) 40 MG tablet Take 1 tablet (40 mg total) by mouth daily. 90 tablet 3   No current facility-administered medications for this visit.     OBJECTIVE: White woman who appears stated age 32:   04/20/20 1434  BP: 120/87  Pulse: 95  Resp: 18  Temp: (!) 97.5 F (36.4 C)  SpO2: 96%  Body mass index is 35.36 kg/m.   Wt Readings from Last 3 Encounters:  04/20/20 199 lb 9.6 oz (90.5 kg)  07/30/19 189 lb 1.6 oz (85.8 kg)  07/10/18 215 lb (97.5 kg)      ECOG FS:1 - Symptomatic but completely ambulatory   Sclerae unicteric, EOMs intact Wearing a mask No cervical or supraclavicular adenopathy Lungs no rales or rhonchi Heart regular rate and rhythm Abd soft, nontender, positive bowel sounds MSK no focal spinal tenderness, no upper extremity lymphedema Neuro: nonfocal, well oriented, appropriate affect Breasts: The right breast is benign.  The left breast is status post lumpectomy.  There is no evidence of local recurrence.  Both axillae are benign.   LAB RESULTS:  CMP     Component Value Date/Time   NA 139 06/12/2018 1018   NA 142 04/17/2012 0000   K 4.0 06/12/2018 1018   CL 105 06/12/2018 1018   CO2 22 06/12/2018 1018   GLUCOSE 106 (H) 06/12/2018 1018   BUN 19 06/12/2018 1018   BUN 19 04/17/2012 0000   CREATININE 0.84 06/12/2018 1018   CREATININE 0.90 05/15/2018 1535   CREATININE 0.71 09/08/2016 1117   CALCIUM 9.6 06/12/2018 1018   PROT 7.1 05/15/2018 1535   ALBUMIN 3.8 05/15/2018 1535   AST 16 05/15/2018 1535    ALT 15 05/15/2018 1535   ALKPHOS 82 05/15/2018 1535   BILITOT 0.5 05/15/2018 1535   GFRNONAA >60 06/12/2018 1018   GFRNONAA >60 05/15/2018 1535   GFRAA >60 06/12/2018 1018   GFRAA >60 05/15/2018 1535    Lab Results  Component Value Date   WBC 8.6 04/20/2020   NEUTROABS 6.3 04/20/2020   HGB 14.8 04/20/2020   HCT 44.3 04/20/2020   MCV 90.2 04/20/2020   PLT 304 04/20/2020   No results found for: LABCA2  No components found for: [id number]  No results for input(s): INR in the last 168 hours.  No results found for: LABCA2  No results found for: HBZ169  No results found for: CVE938  No results found for: BOF751  No results found for: CA2729  No components found for: HGQUANT  No results found for: CEA1 / No results found for: CEA1   No results found for: AFPTUMOR  No results found for: CHROMOGRNA  No results found for: TOTALPROTELP, ALBUMINELP, A1GS, A2GS, BETS, BETA2SER, GAMS, MSPIKE, SPEI (this displays SPEP labs)  No results found for: KPAFRELGTCHN, LAMBDASER, KAPLAMBRATIO (kappa/lambda light chains)  No results found for: HGBA, HGBA2QUANT, HGBFQUANT, HGBSQUAN (Hemoglobinopathy evaluation)   No results found for: LDH  No results found for: IRON, TIBC, IRONPCTSAT (Iron and TIBC)  No results found for: FERRITIN  Urinalysis No results found for: COLORURINE, APPEARANCEUR, LABSPEC, PHURINE, GLUCOSEU, HGBUR, BILIRUBINUR, KETONESUR, PROTEINUR, UROBILINOGEN, NITRITE, LEUKOCYTESUR   STUDIES:  MM DIAG BREAST TOMO BILATERAL  Result Date: 04/15/2020 CLINICAL DATA:  Left lumpectomy.  Annual mammography. EXAM: DIGITAL DIAGNOSTIC BILATERAL MAMMOGRAM WITH TOMO AND CAD COMPARISON:  Previous exam(s). ACR Breast Density Category b: There are scattered areas of fibroglandular density. FINDINGS: Stable left lumpectomy site. No suspicious masses, calcifications, or distortion are seen in either breast. Mammographic images were processed with CAD. IMPRESSION: Stable left  lumpectomy site.  No evidence of malignancy. RECOMMENDATION: Annual diagnostic mammography. I have discussed the findings and recommendations with the patient. If applicable, a reminder letter will be sent to the patient regarding the next appointment. BI-RADS CATEGORY  2: Benign. Electronically Signed   By: Dorise Bullion III M.D   On: 04/15/2020  10:56    ELIGIBLE FOR AVAILABLE RESEARCH PROTOCOL: no   ASSESSMENT: 79 y.o. [place], [place] woman s/p left breast upper outer quadrant biopsy 04/25/2018 for a clinical T2N0, stage IB invasive ductal carcinoma (E-cadherin positive), grade 1 estrogen and progesterone receptor positive, HER-2 not amplified, with an MIB-1 of 3%  (1) Genetic testing performed through [facility]'s Breast Cancer STAT + Common Hereditary Cancers Panel on 05/09/2018 showed no pathogenic mutations. The STAT Breast cancer panel offered by [facility] includes sequencing and rearrangement analysis for the following 9 genes:  ATM, BRCA1, BRCA2, CDH1, CHEK2, PALB2, PTEN, STK11 and TP53.  The Common Hereditary Cancers Panel offered by [facility] includes sequencing and/or deletion duplication testing of the following 47 genes: APC, ATM, AXIN2, BARD1, BMPR1A, BRCA1, BRCA2, BRIP1, CDH1, CDKN2A (p14ARF), CDKN2A (p16INK4a), CKD4, CHEK2, CTNNA1, DICER1, EPCAM (Deletion/duplication testing only), GREM1 (promoter region deletion/duplication testing only), KIT, MEN1, MLH1, MSH2, MSH3, MSH6, MUTYH, NBN, NF1, NHTL1, PALB2, PDGFRA, PMS2, POLD1, POLE, PTEN, RAD50, RAD51C, RAD51D, SDHB, SDHC, SDHD, SMAD4, SMARCA4. STK11, TP53, TSC1, TSC2, and VHL.  The following genes were evaluated for sequence changes only: SDHA and HOXB13 c.251G>A variant only.  (a) A variant of uncertain significance (VUS) in a gene called APC was also noted.  (2) status post left lumpectomy and sentinel lymph node sampling 06/19/2018 for a pT1a pN0, stage IA invasive ductal carcinoma, grade 1, with close but negative margins.  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   PLAN:  Sharonlee is now close to 2 years out from definitive surgery for her breast cancer with no evidence of disease recurrence.  This is very favorable.  She is tolerating anastrozole well and the plan is to continue that a total of 5 years. I am concerned that she is not able to exercise.  She is going to think about the possibility of knee surgery if that is an option.  If not I do wonder whether water aerobics or other water activities might be what she needs.  She will return to see me in 1 year.  She knows to call for any other issue that may develop before then  Total encounter time 20 minutes.  Amany Rando, Virgie Dad, MD  04/20/20 2:53 PM Medical Oncology and Hematology [facility] [place], [place] [address] Tel. [phone]    Fax. [phone]    I, Wilburn Mylar, am acting as scribe for Dr. Virgie Dad. Shaliah Wann.  I, Lurline Del MD, have reviewed the above documentation for accuracy and completeness, and I agree with the above.   *Total Encounter Time as defined by the Centers for Medicare and Medicaid Services includes, in addition to the face-to-face time of a patient visit (documented in the note above) non-face-to-face time: obtaining and reviewing outside history, ordering and reviewing medications, tests or procedures, care coordination (communications with other health care professionals or caregivers) and documentation in the medical record.

## 2020-04-20 ENCOUNTER — Other Ambulatory Visit: Payer: Self-pay

## 2020-04-20 ENCOUNTER — Inpatient Hospital Stay: Payer: Medicare Other | Attending: Oncology | Admitting: Oncology

## 2020-04-20 ENCOUNTER — Inpatient Hospital Stay: Payer: Medicare Other

## 2020-04-20 VITALS — BP 120/87 | HR 95 | Temp 97.5°F | Resp 18 | Ht 63.0 in | Wt 199.6 lb

## 2020-04-20 DIAGNOSIS — C50412 Malignant neoplasm of upper-outer quadrant of left female breast: Secondary | ICD-10-CM | POA: Diagnosis present

## 2020-04-20 DIAGNOSIS — Z170 Estrogen receptor positive status [ER+]: Secondary | ICD-10-CM | POA: Diagnosis not present

## 2020-04-20 DIAGNOSIS — Z803 Family history of malignant neoplasm of breast: Secondary | ICD-10-CM | POA: Insufficient documentation

## 2020-04-20 DIAGNOSIS — Z79811 Long term (current) use of aromatase inhibitors: Secondary | ICD-10-CM | POA: Diagnosis not present

## 2020-04-20 LAB — CMP (CANCER CENTER ONLY)
ALT: 13 U/L (ref 0–44)
AST: 18 U/L (ref 15–41)
Albumin: 4.2 g/dL (ref 3.5–5.0)
Alkaline Phosphatase: 73 U/L (ref 38–126)
Anion gap: 11 (ref 5–15)
BUN: 23 mg/dL (ref 8–23)
CO2: 23 mmol/L (ref 22–32)
Calcium: 9.9 mg/dL (ref 8.9–10.3)
Chloride: 108 mmol/L (ref 98–111)
Creatinine: 0.93 mg/dL (ref 0.44–1.00)
GFR, Estimated: >60 mL/min (ref >60)
Glucose, Bld: 101 mg/dL — ABNORMAL HIGH (ref 70–99)
Potassium: 4.4 mmol/L (ref 3.5–5.1)
Sodium: 142 mmol/L (ref 135–145)
Total Bilirubin: 0.6 mg/dL (ref 0.3–1.2)
Total Protein: 7.5 g/dL (ref 6.5–8.1)

## 2020-04-20 LAB — CBC WITH DIFFERENTIAL (CANCER CENTER ONLY)
Abs Immature Granulocytes: 0.02 10*3/uL (ref 0.00–0.07)
Basophils Absolute: 0.1 10*3/uL (ref 0.0–0.1)
Basophils Relative: 1 %
Eosinophils Absolute: 0 10*3/uL (ref 0.0–0.5)
Eosinophils Relative: 0 %
HCT: 44.3 % (ref 36.0–46.0)
Hemoglobin: 14.8 g/dL (ref 12.0–15.0)
Immature Granulocytes: 0 %
Lymphocytes Relative: 19 %
Lymphs Abs: 1.7 10*3/uL (ref 0.7–4.0)
MCH: 30.1 pg (ref 26.0–34.0)
MCHC: 33.4 g/dL (ref 30.0–36.0)
MCV: 90.2 fL (ref 80.0–100.0)
Monocytes Absolute: 0.5 10*3/uL (ref 0.1–1.0)
Monocytes Relative: 6 %
Neutro Abs: 6.3 10*3/uL (ref 1.7–7.7)
Neutrophils Relative %: 74 %
Platelet Count: 304 10*3/uL (ref 150–400)
RBC: 4.91 MIL/uL (ref 3.87–5.11)
RDW: 12.9 % (ref 11.5–15.5)
WBC Count: 8.6 10*3/uL (ref 4.0–10.5)
nRBC: 0 % (ref 0.0–0.2)

## 2020-04-20 MED ORDER — ANASTROZOLE 1 MG PO TABS: 1.0000 mg | ORAL_TABLET | Freq: Every day | ORAL | 4 refills | Status: DC

## 2020-10-26 ENCOUNTER — Ambulatory Visit: Payer: Medicare Other | Admitting: Adult Health

## 2020-10-28 ENCOUNTER — Ambulatory Visit: Payer: Medicare Other | Admitting: Adult Health

## 2021-01-25 ENCOUNTER — Other Ambulatory Visit: Payer: Self-pay | Admitting: Oncology

## 2021-01-25 DIAGNOSIS — Z853 Personal history of malignant neoplasm of breast: Secondary | ICD-10-CM

## 2021-02-22 ENCOUNTER — Other Ambulatory Visit: Payer: Self-pay | Admitting: Hematology and Oncology

## 2021-04-18 ENCOUNTER — Ambulatory Visit
Admission: RE | Admit: 2021-04-18 | Discharge: 2021-04-18 | Disposition: A | Discharge status: Home / Self Care | Payer: Medicare Other | Source: Ambulatory Visit | Attending: Oncology | Admitting: Oncology

## 2021-04-18 ENCOUNTER — Other Ambulatory Visit: Payer: Self-pay

## 2021-04-18 DIAGNOSIS — Z853 Personal history of malignant neoplasm of breast: Secondary | ICD-10-CM

## 2021-04-20 ENCOUNTER — Other Ambulatory Visit: Payer: Self-pay | Admitting: *Deleted

## 2021-04-20 DIAGNOSIS — Z170 Estrogen receptor positive status [ER+]: Secondary | ICD-10-CM

## 2021-04-20 DIAGNOSIS — C50412 Malignant neoplasm of upper-outer quadrant of left female breast: Secondary | ICD-10-CM

## 2021-04-21 ENCOUNTER — Inpatient Hospital Stay: Payer: Medicare Other

## 2021-04-21 ENCOUNTER — Inpatient Hospital Stay: Payer: Medicare Other | Attending: Oncology | Admitting: Oncology

## 2021-04-21 ENCOUNTER — Other Ambulatory Visit: Payer: Self-pay

## 2021-04-21 VITALS — BP 117/84 | HR 103 | Temp 97.4°F | Resp 18 | Ht 63.0 in | Wt 201.5 lb

## 2021-04-21 DIAGNOSIS — M8580 Other specified disorders of bone density and structure, unspecified site: Secondary | ICD-10-CM | POA: Insufficient documentation

## 2021-04-21 DIAGNOSIS — Z806 Family history of leukemia: Secondary | ICD-10-CM | POA: Diagnosis not present

## 2021-04-21 DIAGNOSIS — Z170 Estrogen receptor positive status [ER+]: Secondary | ICD-10-CM | POA: Insufficient documentation

## 2021-04-21 DIAGNOSIS — C50412 Malignant neoplasm of upper-outer quadrant of left female breast: Secondary | ICD-10-CM | POA: Insufficient documentation

## 2021-04-21 DIAGNOSIS — Z800 Family history of malignant neoplasm of digestive organs: Secondary | ICD-10-CM | POA: Insufficient documentation

## 2021-04-21 DIAGNOSIS — Z803 Family history of malignant neoplasm of breast: Secondary | ICD-10-CM | POA: Diagnosis not present

## 2021-04-21 DIAGNOSIS — Z79811 Long term (current) use of aromatase inhibitors: Secondary | ICD-10-CM | POA: Diagnosis not present

## 2021-04-21 LAB — CBC WITH DIFFERENTIAL (CANCER CENTER ONLY)
Abs Immature Granulocytes: 0.04 10*3/uL (ref 0.00–0.07)
Basophils Absolute: 0 10*3/uL (ref 0.0–0.1)
Basophils Relative: 1 %
Eosinophils Absolute: 0.1 10*3/uL (ref 0.0–0.5)
Eosinophils Relative: 1 %
HCT: 45.7 % (ref 36.0–46.0)
Hemoglobin: 15.1 g/dL — ABNORMAL HIGH (ref 12.0–15.0)
Immature Granulocytes: 1 %
Lymphocytes Relative: 25 %
Lymphs Abs: 1.8 10*3/uL (ref 0.7–4.0)
MCH: 30.3 pg (ref 26.0–34.0)
MCHC: 33 g/dL (ref 30.0–36.0)
MCV: 91.6 fL (ref 80.0–100.0)
Monocytes Absolute: 0.5 10*3/uL (ref 0.1–1.0)
Monocytes Relative: 6 %
Neutro Abs: 5.1 10*3/uL (ref 1.7–7.7)
Neutrophils Relative %: 66 %
Platelet Count: 238 10*3/uL (ref 150–400)
RBC: 4.99 MIL/uL (ref 3.87–5.11)
RDW: 14.1 % (ref 11.5–15.5)
WBC Count: 7.5 10*3/uL (ref 4.0–10.5)
nRBC: 0 % (ref 0.0–0.2)

## 2021-04-21 LAB — CMP (CANCER CENTER ONLY)
ALT: 15 U/L (ref 0–44)
AST: 19 U/L (ref 15–41)
Albumin: 3.9 g/dL (ref 3.5–5.0)
Alkaline Phosphatase: 88 U/L (ref 38–126)
Anion gap: 14 (ref 5–15)
BUN: 25 mg/dL — ABNORMAL HIGH (ref 8–23)
CO2: 22 mmol/L (ref 22–32)
Calcium: 9.7 mg/dL (ref 8.9–10.3)
Chloride: 105 mmol/L (ref 98–111)
Creatinine: 0.87 mg/dL (ref 0.44–1.00)
GFR, Estimated: >60 mL/min (ref >60)
Glucose, Bld: 97 mg/dL (ref 70–99)
Potassium: 3.9 mmol/L (ref 3.5–5.1)
Sodium: 141 mmol/L (ref 135–145)
Total Bilirubin: 0.5 mg/dL (ref 0.3–1.2)
Total Protein: 7.2 g/dL (ref 6.5–8.1)

## 2021-04-21 MED ORDER — ANASTROZOLE 1 MG PO TABS: 1.0000 mg | ORAL_TABLET | Freq: Every day | ORAL | 4 refills | Status: DC

## 2021-04-21 NOTE — Progress Notes (Signed)
[facility]  Telephone:([phone]) [phone] Fax:(336) [phone]     ID: Madison Hardin DOB: 12-27-1940  MR#: [id number]  LFY#:[id number]  Patient Care Team: Javier Glazier, MD as PCP - General (Internal Medicine) Coralie Keens, MD as Consulting Physician (General Surgery) Eppie Gibson, MD as Attending Physician (Radiation Oncology) Damian Hofstra, Virgie Dad, MD as Consulting Physician (Oncology) Pyrtle, Lajuan Lines, MD as Consulting Physician (Gastroenterology) OTHER MD:   CHIEF COMPLAINT: Estrogen receptor positive breast cancer  CURRENT TREATMENT: Anastrozole   INTERVAL HISTORY: Madison Hardin returns today for follow-up of her estrogen receptor positive breast cancer.   She continues on anastrozole.  She has never had any significant or even minor side effects from this medication.  She has no problem continuing it.  Her most recent bone density screening on 11/18/2018 showed a T-score of -1.4, which is minimally osteopenic.  Since her last visit, she underwent bilateral diagnostic mammography with tomography at [facility] on 04/18/2021 showing: breast density category B; no evidence of malignancy in either breast.   REVIEW OF SYSTEMS: Madison Hardin tells me she is no longer ambulatory.  She was I.N.B.E.D.S. several weeks because moving around was very painful.  This is all due to arthritis started with her knees but it is also now her pelvis and other areas.  When she is sitting or lying and not moving very much she is perfectly fine.  She does not feel or has been told that she is not a candidate for surgery for example knee replacement.  Accordingly she is now using a scooter and this does allow her to get around.  She manages to get to the weight room and do some exercise there and also sometimes goes to the pool.  He is also experiencing a "food addiction".  This comes and goes but when it comes she cannot stop herself from eating all sorts of food that she knows she should not be  eating.  Aside from these issues a detailed review of systems today was stable   COVID 19 VACCINATION STATUS: Has never had COVID.  She has had vaccines x4   HISTORY OF CURRENT ILLNESS: From the original intake note:  Madison Hardin had routine screening mammography on 04/10/2018 showing a possible abnormality in the left breast. She underwent unilateral left diagnostic mammography with tomography and left breast ultrasonography at [facility] on 04/23/2018 showing: Breast Density Category B. In the middle third of the slightly upper and outer left breast is a group of heterogeneous calcifications spanning 2.5 x 2.5 x 1.4 cm on mammography. Ultrasound of the left axilla demonstrates multiple normal-appearing lymph nodes. No suspicious masses or lymph nodes with cortical thickening are identified by ultrasonography.   Accordingly on 04/25/2018 she proceeded to biopsy of the left breast area in question. The pathology from this procedure showed [phone]): Invasive mammary carcinoma, e-cadherin positive, grade 1 Prognostic indicators significant for: estrogen receptor, 100% positive and progesterone receptor, 100% positive, both with strong staining intensity. Proliferation marker Ki67 at 3%. HER2 negative immunohistochemical and morphometric analysis (1+).   The patient's subsequent history is as detailed below.    PAST MEDICAL HISTORY: Past Medical History:  Diagnosis Date   Arthritis    Breast cancer ([place])    Cataract 03/2018   Surgery on both eyes in 03/2018   Family history of breast cancer    Family history of pancreatic cancer    Family history of prostate cancer    GERD (gastroesophageal reflux disease)  Hyperlipidemia    Hypertension    Malignant neoplasm of upper-outer quadrant of left breast in female, estrogen receptor positive ([place]) 05/15/2018   Pneumonia    as child    PAST SURGICAL HISTORY: Past Surgical History:  Procedure Laterality Date   ABDOMINAL  HYSTERECTOMY     AUGMENTATION MAMMAPLASTY     Pt had explants   BREAST EXCISIONAL BIOPSY Left    BREAST LUMPECTOMY     BREAST LUMPECTOMY WITH RADIOACTIVE SEED AND SENTINEL LYMPH NODE BIOPSY Left 06/17/2018   Procedure: LEFT BREAST PARTIAL MASTECTOMY WITH RADIOACTIVE SEED AND SENTINEL LYMPH NODE BIOPSY;  Surgeon: Coralie Keens, MD;  Location: [facility];  Service: General;  Laterality: Left;   BREAST SURGERY     COLONOSCOPY     last 2010 in [place]     flushed per pt.   POLYPECTOMY     last 2010   TONSILLECTOMY     UPPER GASTROINTESTINAL ENDOSCOPY     UTERINE FIBROID SURGERY  1980    FAMILY HISTORY Family History  Problem Relation Age of Onset   Breast cancer Mother 57       without treatment   Prostate cancer Father 27   Penile cancer Father    Leukemia Paternal Grandmother    Pancreatic cancer Paternal Aunt        dx late 70s   Colon cancer Neg Hx    Rectal cancer Neg Hx    Stomach cancer Neg Hx    Esophageal cancer Neg Hx    Colon polyps Neg Hx   The patient's father had a history of prostate and penile cancer; he died at age 80. Patients' mother died from breast cancer at age 51.  Apparently she can seal the cancer for a long time before bringing it to medical attention and then refused treatment.  The patient has no siblings. Patient denies anyone in her family having ovarian cancer. The patient's paternal aunt had pancreatic cancer.   GYNECOLOGIC HISTORY:  Menarche: 80 years old GX P: 0 LMP: No LMP recorded. Patient has had a hysterectomy. 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 She lives alone (independent living at [facility]) and has no pets. She has no children. She does not attend a church/synagogue/mosque.   ADVANCED DIRECTIVES: Her healthcare power of  attorney is longtime friend Maren Reamer, [phone].   HEALTH MAINTENANCE: Social History   Tobacco Use   Smoking status: Former    Types: Cigarettes    Quit date: 06/05/1978    Years since quitting: 42.9   Smokeless tobacco: Never  Vaping Use   Vaping Use: Never used  Substance Use Topics   Alcohol use: No   Drug use: No     Colonoscopy: 01/2018 (Dr. Hilarie Fredrickson)  PAP: >5 years ago  Bone density:11/2018, -1.4   No Known Allergies  Current Outpatient Medications  Medication Sig Dispense Refill   anastrozole (ARIMIDEX) 1 MG tablet TAKE 1 TABLET DAILY 90 tablet 3   Cholecalciferol (VITAMIN D) 125 MCG (5000 UT) CAPS Take 5,000 Units by mouth daily.      MAGNESIUM PO Take 320 mg by mouth daily.      Multiple Vitamins-Minerals (ICAPS AREDS 2 PO) Take 1 tablet by mouth 2 (two) times daily.  omeprazole (PRILOSEC) 10 MG capsule Take 1 capsule (10 mg total) by mouth daily. 90 capsule 3   pregabalin (LYRICA) 50 MG capsule Take 1 capsule (50 mg total) by mouth 3 (three) times daily.     rosuvastatin (CRESTOR) 20 MG tablet Take 20 mg by mouth 2 (two) times a week.     telmisartan (MICARDIS) 40 MG tablet Take 1 tablet (40 mg total) by mouth daily. 90 tablet 3   No current facility-administered medications for this visit.    OBJECTIVE: White woman examined in a chair ( not the examination table). Vitals:   04/21/21 1241  BP: 117/84  Pulse: (!) 103  Resp: 18  Temp: (!) 97.4 F (36.3 C)  SpO2: 100%      Body mass index is 35.69 kg/m.   Wt Readings from Last 3 Encounters:  04/21/21 201 lb 8 oz (91.4 kg)  04/20/20 199 lb 9.6 oz (90.5 kg)  07/30/19 189 lb 1.6 oz (85.8 kg)      ECOG FS:1 - Symptomatic but completely ambulatory   Sclerae unicteric, EOMs intact Wearing a mask No cervical or supraclavicular adenopathy Lungs no rales or rhonchi Heart regular rate and rhythm Abd soft, obese nontender, positive bowel sounds MSK no focal spinal tenderness Neuro: nonfocal, well  oriented, appropriate and positive affect; she was able to give me her healthcare power of attorney's phone number without having to look it up Breasts: The right breast is benign.  The left breast has undergone lumpectomy.  There is no evidence of disease recurrence.  Both axillae are benign.   LAB RESULTS:  CMP     Component Value Date/Time   NA 142 04/20/2020 1416   NA 142 04/17/2012 0000   K 4.4 04/20/2020 1416   CL 108 04/20/2020 1416   CO2 23 04/20/2020 1416   GLUCOSE 101 (H) 04/20/2020 1416   BUN 23 04/20/2020 1416   BUN 19 04/17/2012 0000   CREATININE 0.93 04/20/2020 1416   CREATININE 0.71 09/08/2016 1117   CALCIUM 9.9 04/20/2020 1416   PROT 7.5 04/20/2020 1416   ALBUMIN 4.2 04/20/2020 1416   AST 18 04/20/2020 1416   ALT 13 04/20/2020 1416   ALKPHOS 73 04/20/2020 1416   BILITOT 0.6 04/20/2020 1416   GFRNONAA >60 04/20/2020 1416   GFRAA >60 06/12/2018 1018   GFRAA >60 05/15/2018 1535    Lab Results  Component Value Date   WBC 7.5 04/21/2021   NEUTROABS 5.1 04/21/2021   HGB 15.1 (H) 04/21/2021   HCT 45.7 04/21/2021   MCV 91.6 04/21/2021   PLT 238 04/21/2021   No results found for: LABCA2  No components found for: XBDZHG992  No results for input(s): INR in the last 168 hours.  No results found for: LABCA2  No results found for: EQA834  No results found for: HDQ222  No results found for: LNL892  No results found for: CA2729  No components found for: HGQUANT  No results found for: CEA1 / No results found for: CEA1   No results found for: AFPTUMOR  No results found for: CHROMOGRNA  No results found for: TOTALPROTELP, ALBUMINELP, A1GS, A2GS, BETS, BETA2SER, GAMS, MSPIKE, SPEI (this displays SPEP labs)  No results found for: KPAFRELGTCHN, LAMBDASER, KAPLAMBRATIO (kappa/lambda light chains)  No results found for: HGBA, HGBA2QUANT, HGBFQUANT, HGBSQUAN (Hemoglobinopathy evaluation)   No results found for: LDH  No results found for: IRON, TIBC,  IRONPCTSAT (Iron and TIBC)  No results found for: FERRITIN  Urinalysis No results found for:  COLORURINE, APPEARANCEUR, LABSPEC, PHURINE, GLUCOSEU, HGBUR, BILIRUBINUR, KETONESUR, PROTEINUR, UROBILINOGEN, NITRITE, LEUKOCYTESUR   STUDIES:  MM DIAG BREAST TOMO BILATERAL  Result Date: 04/18/2021 CLINICAL DATA:  80 year old female presenting for annual exam. History of left breast cancer in 2020 status post lumpectomy. No new problems. EXAM: DIGITAL DIAGNOSTIC BILATERAL MAMMOGRAM WITH TOMOSYNTHESIS AND CAD TECHNIQUE: Bilateral digital diagnostic mammography and breast tomosynthesis was performed. The images were evaluated with computer-aided detection. COMPARISON:  Previous exam(s). ACR Breast Density Category b: There are scattered areas of fibroglandular density. FINDINGS: Right breast: No suspicious mass, distortion, or microcalcifications are identified to suggest presence of malignancy. Left breast: A spot 2D magnification view of the lumpectomy site was performed in addition to standard views. There are stable postsurgical changes. No suspicious mass, distortion, or microcalcifications are identified to suggest presence of malignancy. IMPRESSION: Stable postsurgical changes in the left breast. No mammographic evidence of malignancy bilaterally. RECOMMENDATION: As the patient is now over 2 years out from her lumpectomy, she may return to annual screening mammography in 1 year. Given her history of breast cancer, she remains eligible for annual diagnostic mammography, if preferred. I have discussed the findings and recommendations with the patient. If applicable, a reminder letter will be sent to the patient regarding the next appointment. BI-RADS CATEGORY  2: Benign. Electronically Signed   By: Audie Pinto M.D.   On: 04/18/2021 11:08    ELIGIBLE FOR AVAILABLE RESEARCH PROTOCOL: no   ASSESSMENT: 80 y.o. [place], [place] woman s/p left breast upper outer quadrant biopsy 04/25/2018 for a clinical  T2N0, stage IB invasive ductal carcinoma (E-cadherin positive), grade 1 estrogen and progesterone receptor positive, HER-2 not amplified, with an MIB-1 of 3%  (1) Genetic testing performed through [facility]'s Breast Cancer STAT + Common Hereditary Cancers Panel on 05/09/2018 showed no pathogenic mutations. The STAT Breast cancer panel offered by [facility] includes sequencing and rearrangement analysis for the following 9 genes:  ATM, BRCA1, BRCA2, CDH1, CHEK2, PALB2, PTEN, STK11 and TP53.  The Common Hereditary Cancers Panel offered by [facility] includes sequencing and/or deletion duplication testing of the following 47 genes: APC, ATM, AXIN2, BARD1, BMPR1A, BRCA1, BRCA2, BRIP1, CDH1, CDKN2A (p14ARF), CDKN2A (p16INK4a), CKD4, CHEK2, CTNNA1, DICER1, EPCAM (Deletion/duplication testing only), GREM1 (promoter region deletion/duplication testing only), KIT, MEN1, MLH1, MSH2, MSH3, MSH6, MUTYH, NBN, NF1, NHTL1, PALB2, PDGFRA, PMS2, POLD1, POLE, PTEN, RAD50, RAD51C, RAD51D, SDHB, SDHC, SDHD, SMAD4, SMARCA4. STK11, TP53, TSC1, TSC2, and VHL.  The following genes were evaluated for sequence changes only: SDHA and HOXB13 c.251G>A variant only.  (a) A variant of uncertain significance (VUS) in a gene called APC was also noted.  (2) status post left lumpectomy and sentinel lymph node sampling 06/19/2018 for a pT1a pN0, stage IA invasive ductal carcinoma, grade 1, with close but negative margins.  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   PLAN:  Madison Hardin is coming up on 3 years from definitive surgery for her breast cancer with no evidence of disease recurrence.  This is favorable.  She is tolerating anastrozole well and the plan is to continue that a total of 3 years.  I have referred her request for counseling to our chaplain  who tells me that she does have someone in mind that might be helpful to Madison Hardin.  Otherwise says she will return to see [place]  in 1 year.  She knows to call for any other issue that may develop before then.  Total encounter time 20 minutes.   Caran Storck, Virgie Dad, MD  04/21/21 12:50 PM Medical Oncology and Hematology [facility] [place], [place] [address] Tel. [phone]    Fax. [phone]    I, Wilburn Mylar, am acting as scribe for Dr. Virgie Dad. Sayf Kerner.  I, Lurline Del MD, have reviewed the above documentation for accuracy and completeness, and I agree with the above.   *Total Encounter Time as defined by the Centers for Medicare and Medicaid Services includes, in addition to the face-to-face time of a patient visit (documented in the note above) non-face-to-face time: obtaining and reviewing outside history, ordering and reviewing medications, tests or procedures, care coordination (communications with other health care professionals or caregivers) and documentation in the medical record.

## 2021-04-22 ENCOUNTER — Encounter: Payer: Self-pay | Admitting: General Practice

## 2021-04-22 NOTE — Progress Notes (Signed)
[facility] Spiritual Care Note  Reached Madison Hardin by phone per referral from Dr Jana Hakim for support related to possible food addiction. Recommended reaching out to counselor Verdis Frederickson Paredes/PhD of [facility] in [place], who specializes in supporting people with a holistic, healing, empowering approach to emotional wellbeing and body enjoyment, including exploring food and eating. Even if she does not have openings or take the right insurance, she is likely to be a helpful resource for referrals. Madison Hardin was very appreciative and has my direct-dial number in case other needs arise.   [place], [place], [facility] Pager [phone] Voicemail [phone]

## 2021-11-14 ENCOUNTER — Other Ambulatory Visit: Payer: Self-pay | Admitting: Hematology and Oncology

## 2022-03-20 ENCOUNTER — Other Ambulatory Visit: Payer: Self-pay | Admitting: Hematology and Oncology

## 2022-03-20 DIAGNOSIS — Z9889 Other specified postprocedural states: Secondary | ICD-10-CM

## 2022-04-19 ENCOUNTER — Ambulatory Visit
Admission: RE | Admit: 2022-04-19 | Discharge: 2022-04-19 | Disposition: A | Discharge status: Home / Self Care | Payer: Medicare Other | Source: Ambulatory Visit | Attending: Hematology and Oncology | Admitting: Hematology and Oncology

## 2022-04-19 DIAGNOSIS — Z9889 Other specified postprocedural states: Secondary | ICD-10-CM

## 2022-04-20 ENCOUNTER — Other Ambulatory Visit: Payer: Self-pay

## 2022-04-20 DIAGNOSIS — Z170 Estrogen receptor positive status [ER+]: Secondary | ICD-10-CM

## 2022-04-21 ENCOUNTER — Inpatient Hospital Stay (HOSPITAL_BASED_OUTPATIENT_CLINIC_OR_DEPARTMENT_OTHER): Payer: Medicare Other | Admitting: Hematology and Oncology

## 2022-04-21 ENCOUNTER — Inpatient Hospital Stay: Payer: Medicare Other | Attending: Hematology and Oncology

## 2022-04-21 ENCOUNTER — Encounter: Payer: Self-pay | Admitting: Hematology and Oncology

## 2022-04-21 VITALS — BP 122/87 | HR 101 | Temp 98.1°F | Resp 16 | Ht 63.0 in | Wt 182.2 lb

## 2022-04-21 DIAGNOSIS — C50412 Malignant neoplasm of upper-outer quadrant of left female breast: Secondary | ICD-10-CM | POA: Insufficient documentation

## 2022-04-21 DIAGNOSIS — Z803 Family history of malignant neoplasm of breast: Secondary | ICD-10-CM | POA: Insufficient documentation

## 2022-04-21 DIAGNOSIS — Z1231 Encounter for screening mammogram for malignant neoplasm of breast: Secondary | ICD-10-CM

## 2022-04-21 DIAGNOSIS — Z87891 Personal history of nicotine dependence: Secondary | ICD-10-CM | POA: Diagnosis not present

## 2022-04-21 DIAGNOSIS — Z170 Estrogen receptor positive status [ER+]: Secondary | ICD-10-CM | POA: Insufficient documentation

## 2022-04-21 DIAGNOSIS — M8589 Other specified disorders of bone density and structure, multiple sites: Secondary | ICD-10-CM | POA: Diagnosis not present

## 2022-04-21 DIAGNOSIS — M8580 Other specified disorders of bone density and structure, unspecified site: Secondary | ICD-10-CM | POA: Diagnosis not present

## 2022-04-21 DIAGNOSIS — Z79811 Long term (current) use of aromatase inhibitors: Secondary | ICD-10-CM | POA: Diagnosis not present

## 2022-04-21 LAB — CBC WITH DIFFERENTIAL (CANCER CENTER ONLY)
Abs Immature Granulocytes: 0.02 10*3/uL (ref 0.00–0.07)
Basophils Absolute: 0 10*3/uL (ref 0.0–0.1)
Basophils Relative: 1 %
Eosinophils Absolute: 0.1 10*3/uL (ref 0.0–0.5)
Eosinophils Relative: 1 %
HCT: 42 % (ref 36.0–46.0)
Hemoglobin: 14 g/dL (ref 12.0–15.0)
Immature Granulocytes: 0 %
Lymphocytes Relative: 24 %
Lymphs Abs: 1.6 10*3/uL (ref 0.7–4.0)
MCH: 30.8 pg (ref 26.0–34.0)
MCHC: 33.3 g/dL (ref 30.0–36.0)
MCV: 92.5 fL (ref 80.0–100.0)
Monocytes Absolute: 0.5 10*3/uL (ref 0.1–1.0)
Monocytes Relative: 7 %
Neutro Abs: 4.4 10*3/uL (ref 1.7–7.7)
Neutrophils Relative %: 67 %
Platelet Count: 258 10*3/uL (ref 150–400)
RBC: 4.54 MIL/uL (ref 3.87–5.11)
RDW: 13.7 % (ref 11.5–15.5)
WBC Count: 6.6 10*3/uL (ref 4.0–10.5)
nRBC: 0 % (ref 0.0–0.2)

## 2022-04-21 LAB — CMP (CANCER CENTER ONLY)
ALT: 12 U/L (ref 0–44)
AST: 15 U/L (ref 15–41)
Albumin: 4.4 g/dL (ref 3.5–5.0)
Alkaline Phosphatase: 70 U/L (ref 38–126)
Anion gap: 10 (ref 5–15)
BUN: 16 mg/dL (ref 8–23)
CO2: 24 mmol/L (ref 22–32)
Calcium: 10 mg/dL (ref 8.9–10.3)
Chloride: 104 mmol/L (ref 98–111)
Creatinine: 0.69 mg/dL (ref 0.44–1.00)
GFR, Estimated: >60 mL/min (ref >60)
Glucose, Bld: 100 mg/dL — ABNORMAL HIGH (ref 70–99)
Potassium: 4.2 mmol/L (ref 3.5–5.1)
Sodium: 138 mmol/L (ref 135–145)
Total Bilirubin: 0.6 mg/dL (ref 0.3–1.2)
Total Protein: 7.2 g/dL (ref 6.5–8.1)

## 2022-04-21 NOTE — Progress Notes (Signed)
[place]  [phone] Fax:([phone]) [phone]     ID: Madison Hardin DOB: [birth year]  MR#: [id number]  IWL#:[id number]  Patient Care Team: Benay Pike, MD as PCP - General (Hematology and Oncology) Coralie Keens, MD as Consulting Physician (General Surgery) Eppie Gibson, MD as Attending Physician (Radiation Oncology) Pyrtle, Lajuan Lines, MD as Consulting Physician (Gastroenterology) OTHER MD:   CHIEF COMPLAINT: Estrogen receptor positive breast cancer  CURRENT TREATMENT: Anastrozole   INTERVAL HISTORY:  Madison Hardin returns today for follow-up of her estrogen receptor positive breast cancer.  She continues on anastrozole, doing very well overall. Last mammogram Nov 2023 negative for malignancy. Her most recent bone density screening on 11/18/2018 showed a T-score of -1.4, which is minimally osteopenic. She has lost a bunch of weight and she is very excited about this. She is now on ozempic and this has been helping tremendously, Rest of the pertinent 10 point reviewed and negative.  REVIEW OF SYSTEMS: Madison Hardin tells me she is no longer ambulatory.  She was I.N.B.E.D.S. several weeks because moving around was very painful.  This is all due to arthritis started with her knees but it is also now her pelvis and other areas.  When she is sitting or lying and not moving very much she is perfectly fine.  She does not feel or has been told that she is not a candidate for surgery for example knee replacement.  Accordingly she is now using a scooter and this does allow her to get around.  She manages to get to the weight room and do some exercise there and also sometimes goes to the pool.  He is also experiencing a "food addiction".  This comes and goes but when it comes she cannot stop herself from eating all sorts of food that she knows she should not be eating.  Aside from these issues a detailed review of systems today was stable   COVID 19 VACCINATION STATUS: Has never  had COVID.  She has had vaccines x4   HISTORY OF CURRENT ILLNESS: From the original intake note:  Madison Hardin had routine screening mammography on 04/10/2018 showing a possible abnormality in the left breast. She underwent unilateral left diagnostic mammography with tomography and left breast ultrasonography at [facility] on 04/23/2018 showing: Breast Density Category B. In the middle third of the slightly upper and outer left breast is a group of heterogeneous calcifications spanning 2.5 x 2.5 x 1.4 cm on mammography. Ultrasound of the left axilla demonstrates multiple normal-appearing lymph nodes. No suspicious masses or lymph nodes with cortical thickening are identified by ultrasonography.   Accordingly on 04/25/2018 she proceeded to biopsy of the left breast area in question. The pathology from this procedure showed [id number]): Invasive mammary carcinoma, e-cadherin positive, grade 1 Prognostic indicators significant for: estrogen receptor, 100% positive and progesterone receptor, 100% positive, both with strong staining intensity. Proliferation marker Ki67 at 3%. HER2 negative immunohistochemical and morphometric analysis (1+).   The patient's subsequent history is as detailed below.    PAST MEDICAL HISTORY: Past Medical History:  Diagnosis Date   Arthritis    Breast cancer ([place])    Cataract 03/2018   Surgery on both eyes in 03/2018   Family history of breast cancer    Family history of pancreatic cancer    Family history of prostate cancer    GERD (gastroesophageal reflux disease)    Hyperlipidemia    Hypertension    Malignant neoplasm of upper-outer quadrant of left breast  in female, estrogen receptor positive ([place]) 05/15/2018   Pneumonia    as child    PAST SURGICAL HISTORY: Past Surgical History:  Procedure Laterality Date   ABDOMINAL HYSTERECTOMY     AUGMENTATION MAMMAPLASTY     Pt had explants   BREAST EXCISIONAL BIOPSY Left    BREAST LUMPECTOMY      BREAST LUMPECTOMY WITH RADIOACTIVE SEED AND SENTINEL LYMPH NODE BIOPSY Left 06/17/2018   Procedure: LEFT BREAST PARTIAL MASTECTOMY WITH RADIOACTIVE SEED AND SENTINEL LYMPH NODE BIOPSY;  Surgeon: Coralie Keens, MD;  Location: [facility];  Service: General;  Laterality: Left;   BREAST SURGERY     COLONOSCOPY     last 2010 in [place]     flushed per pt.   POLYPECTOMY     last 2010   TONSILLECTOMY     UPPER GASTROINTESTINAL ENDOSCOPY     UTERINE FIBROID SURGERY  1980    FAMILY HISTORY Family History  Problem Relation Age of Onset   Breast cancer Mother 107       without treatment   Prostate cancer Father 86   Penile cancer Father    Leukemia Paternal Grandmother    Pancreatic cancer Paternal Aunt        dx late 76s   Colon cancer Neg Hx    Rectal cancer Neg Hx    Stomach cancer Neg Hx    Esophageal cancer Neg Hx    Colon polyps Neg Hx   The patient's father had a history of prostate and penile cancer; he died at age 53. Patients' mother died from breast cancer at age 37.  Apparently she can seal the cancer for a long time before bringing it to medical attention and then refused treatment.  The patient has no siblings. Patient denies anyone in her family having ovarian cancer. The patient's paternal aunt had pancreatic cancer.   GYNECOLOGIC HISTORY:  Menarche: 81 years old GX P: 0 LMP: No LMP recorded. Patient has had a hysterectomy. 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 She lives alone (independent living at [facility]) and has no pets. She has no children. She does not attend a church/synagogue/mosque.   ADVANCED DIRECTIVES: Her healthcare power of attorney is longtime friend Maren Reamer, [phone].   HEALTH MAINTENANCE: Social History   Tobacco Use   Smoking  status: Former    Types: Cigarettes    Quit date: 06/05/1978    Years since quitting: 43.9   Smokeless tobacco: Never  Vaping Use   Vaping Use: Never used  Substance Use Topics   Alcohol use: No   Drug use: No     Colonoscopy: 01/2018 (Dr. Hilarie Fredrickson)  PAP: >5 years ago  Bone density:11/2018, -1.4   No Known Allergies  Current Outpatient Medications  Medication Sig Dispense Refill   anastrozole (ARIMIDEX) 1 MG tablet TAKE 1 TABLET DAILY 90 tablet 3   Cholecalciferol (VITAMIN D) 125 MCG (5000 UT) CAPS Take 5,000 Units by mouth daily.      MAGNESIUM PO Take 320 mg by mouth daily.      Multiple Vitamins-Minerals (ICAPS AREDS 2 PO) Take 1 tablet by mouth 2 (two) times daily.      omeprazole (PRILOSEC) 10 MG capsule Take 1 capsule (10 mg total) by mouth daily. [place]  capsule 3   pregabalin (LYRICA) 50 MG capsule Take 1 capsule (50 mg total) by mouth 3 (three) times daily.     rosuvastatin (CRESTOR) 20 MG tablet Take 20 mg by mouth 2 (two) times a week.     telmisartan (MICARDIS) 40 MG tablet Take 1 tablet (40 mg total) by mouth daily. 90 tablet 3   No current facility-administered medications for this visit.    OBJECTIVE: White woman examined in a chair ( not the examination table). Vitals:   04/21/22 1244  BP: 122/87  Pulse: (!) 101  Resp: 16  Temp: 98.1 F (36.7 C)  SpO2: 96%      Body mass index is 32.28 kg/m.   Wt Readings from Last 3 Encounters:  04/21/22 182 lb 3.2 oz (82.6 kg)  04/21/21 201 lb 8 oz (91.4 kg)  04/20/20 199 lb 9.6 oz (90.5 kg)      ECOG FS:1 - Symptomatic but completely ambulatory   Physical Exam Constitutional:      Appearance: Normal appearance.  Chest:     Comments: Bilateral breasts inspected.  No palpable masses or regional adenopathy.  She has bilaterally chronically inverted nipples for decades she states. Musculoskeletal:     Cervical back: Normal range of motion and neck supple. No rigidity.  Lymphadenopathy:     Cervical: No cervical  adenopathy.  Neurological:     Mental Status: She is alert.       LAB RESULTS:  CMP     Component Value Date/Time   NA 138 04/21/2022 1230   NA 142 04/17/2012 0000   K 4.2 04/21/2022 1230   CL 104 04/21/2022 1230   CO2 24 04/21/2022 1230   GLUCOSE 100 (H) 04/21/2022 1230   BUN 16 04/21/2022 1230   BUN 19 04/17/2012 0000   CREATININE 0.69 04/21/2022 1230   CREATININE 0.71 09/08/2016 1117   CALCIUM 10.0 04/21/2022 1230   PROT 7.2 04/21/2022 1230   ALBUMIN 4.4 04/21/2022 1230   AST 15 04/21/2022 1230   ALT 12 04/21/2022 1230   ALKPHOS 70 04/21/2022 1230   BILITOT 0.6 04/21/2022 1230   GFRNONAA >60 04/21/2022 1230   GFRAA >60 06/12/2018 1018   GFRAA >60 05/15/2018 1535    Lab Results  Component Value Date   WBC 6.6 04/21/2022   NEUTROABS 4.4 04/21/2022   HGB 14.0 04/21/2022   HCT 42.0 04/21/2022   MCV 92.5 04/21/2022   PLT 258 04/21/2022   No results found for: "LABCA2"  No components found for: "PZWCHE527"  No results for input(s): "INR" in the last 168 hours.  No results found for: "LABCA2"  No results found for: "POE423"  No results found for: "CAN125"  No results found for: "CAN153"  No results found for: "CA2729"  No components found for: "HGQUANT"  No results found for: "CEA1", "CEA" / No results found for: "CEA1", "CEA"   No results found for: "AFPTUMOR"  No results found for: "CHROMOGRNA"  No results found for: "TOTALPROTELP", "ALBUMINELP", "A1GS", "A2GS", "BETS", "BETA2SER", "GAMS", "MSPIKE", "SPEI" (this displays SPEP labs)  No results found for: "KPAFRELGTCHN", "LAMBDASER", "KAPLAMBRATIO" (kappa/lambda light chains)  No results found for: "HGBA", "HGBA2QUANT", "HGBFQUANT", "HGBSQUAN" (Hemoglobinopathy evaluation)   No results found for: "LDH"  No results found for: "IRON", "TIBC", "IRONPCTSAT" (Iron and TIBC)  No results found for: "FERRITIN"  Urinalysis No results found for: "COLORURINE", "APPEARANCEUR", "LABSPEC",  "PHURINE", "GLUCOSEU", "HGBUR", "BILIRUBINUR", "KETONESUR", "PROTEINUR", "UROBILINOGEN", "NITRITE", "LEUKOCYTESUR"   STUDIES:  MM DIAG BREAST TOMO BILATERAL  Result Date: 04/19/2022 CLINICAL DATA:  Left lumpectomy.  Annual mammography. EXAM: DIGITAL DIAGNOSTIC BILATERAL MAMMOGRAM WITH TOMOSYNTHESIS TECHNIQUE: Bilateral digital diagnostic mammography and breast tomosynthesis was performed. COMPARISON:  Previous exam(s). ACR Breast Density Category b: There are scattered areas of fibroglandular density. FINDINGS: The left lumpectomy site is stable. No suspicious masses, calcifications, or distortion identified in either breast. IMPRESSION: No mammographic evidence of malignancy. RECOMMENDATION: Per protocol, as the patient is now 2 or more years status post lumpectomy, she may return to annual screening mammography in 1 year. However, given the history of breast cancer, the patient remains eligible for annual diagnostic mammography if preferred. I have discussed the findings and recommendations with the patient. If applicable, a reminder letter will be sent to the patient regarding the next appointment. BI-RADS CATEGORY  2: Benign. Electronically Signed   By: Dorise Bullion III M.D.   On: 04/19/2022 11:47    ELIGIBLE FOR AVAILABLE RESEARCH PROTOCOL: no   ASSESSMENT: 81 y.o. [place] woman s/p left breast upper outer quadrant biopsy 04/25/2018 for a clinical T2N0, stage IB invasive ductal carcinoma (E-cadherin positive), grade 1 estrogen and progesterone receptor positive, HER-2 not amplified, with an MIB-1 of 3%  (1) Genetic testing performed through [facility]'s Breast Cancer STAT + Common Hereditary Cancers Panel on 05/09/2018 showed no pathogenic mutations. The STAT Breast cancer panel offered by [facility] includes sequencing and rearrangement analysis for the following 9 genes:  ATM, BRCA1, BRCA2, CDH1, CHEK2, PALB2, PTEN, STK11 and TP53.  The Common Hereditary Cancers Panel offered by [facility]  includes sequencing and/or deletion duplication testing of the following 47 genes: APC, ATM, AXIN2, BARD1, BMPR1A, BRCA1, BRCA2, BRIP1, CDH1, CDKN2A (p14ARF), CDKN2A (p16INK4a), CKD4, CHEK2, CTNNA1, DICER1, EPCAM (Deletion/duplication testing only), GREM1 (promoter region deletion/duplication testing only), KIT, MEN1, MLH1, MSH2, MSH3, MSH6, MUTYH, NBN, NF1, NHTL1, PALB2, PDGFRA, PMS2, POLD1, POLE, PTEN, RAD50, RAD51C, RAD51D, SDHB, SDHC, SDHD, SMAD4, SMARCA4. STK11, TP53, TSC1, TSC2, and VHL.  The following genes were evaluated for sequence changes only: SDHA and HOXB13 c.251G>A variant only.  (a) A variant of uncertain significance (VUS) in a gene called APC was also noted.  (2) status post left lumpectomy and sentinel lymph node sampling 06/19/2018 for a pT1a pN0, stage IA invasive ductal carcinoma, grade 1, with close but negative margins.  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   PLAN:  Madison Hardin is here for follow-up on anastrozole.  She is now about 4 years from definitive surgery for her breast cancer.  No concern for recurrence.  Physical examination today without any concerns for recurrence, chronically inverted nipples noted. Most recent mammogram from November 2023 with no concerns for malignancy.  Screening mammogram ordered for November 2024.  She is also due for bone density, last bone density in January 2020.  This has been ordered.  I encouraged her to consider some weightbearing exercises, some vitamin D supplementation and calcium rich foods. She is tolerating anastrozole well and the plan is to continue that a total of 5 years.  Total time spent: 30 min  *Total Encounter Time as defined by the Centers for Medicare and Medicaid Services includes, in addition to the face-to-face time of a  patient visit (documented in the note above) non-face-to-face time: obtaining and reviewing outside history, ordering and reviewing medications, tests or procedures,  care coordination (communications with other health care professionals or caregivers) and documentation in the medical record.

## 2022-05-01 ENCOUNTER — Other Ambulatory Visit: Payer: Self-pay | Admitting: Hematology and Oncology

## 2022-05-01 DIAGNOSIS — Z1231 Encounter for screening mammogram for malignant neoplasm of breast: Secondary | ICD-10-CM

## 2022-05-01 DIAGNOSIS — Z170 Estrogen receptor positive status [ER+]: Secondary | ICD-10-CM

## 2022-10-16 ENCOUNTER — Ambulatory Visit
Admission: RE | Admit: 2022-10-16 | Discharge: 2022-10-16 | Disposition: A | Discharge status: Home / Self Care | Payer: Medicare Other | Source: Ambulatory Visit | Attending: Hematology and Oncology | Admitting: Hematology and Oncology

## 2022-10-16 DIAGNOSIS — M8589 Other specified disorders of bone density and structure, multiple sites: Secondary | ICD-10-CM

## 2022-10-16 DIAGNOSIS — Z170 Estrogen receptor positive status [ER+]: Secondary | ICD-10-CM

## 2022-10-17 ENCOUNTER — Telehealth: Payer: Self-pay | Admitting: Hematology and Oncology

## 2022-10-17 NOTE — Telephone Encounter (Signed)
Spoke with patient confirming upcoming appointment  

## 2022-11-09 ENCOUNTER — Inpatient Hospital Stay: Payer: Medicare Other | Attending: Hematology and Oncology | Admitting: Hematology and Oncology

## 2022-11-09 DIAGNOSIS — M85859 Other specified disorders of bone density and structure, unspecified thigh: Secondary | ICD-10-CM

## 2022-11-09 DIAGNOSIS — Z170 Estrogen receptor positive status [ER+]: Secondary | ICD-10-CM

## 2022-11-09 DIAGNOSIS — C50412 Malignant neoplasm of upper-outer quadrant of left female breast: Secondary | ICD-10-CM | POA: Diagnosis not present

## 2022-11-09 NOTE — Progress Notes (Signed)
[facility]  [phone] [phone]     ID: Madison Hardin DOB: 1940-09-16  MR#: [id number]  UYQ#:[id number]  Patient Care Team: Rachel Moulds, MD as PCP - General (Hematology and Oncology) Abigail Miyamoto, MD as Consulting Physician (General Surgery) Lonie Peak, MD as Attending Physician (Radiation Oncology) Pyrtle, Carie Caddy, MD as Consulting Physician (Gastroenterology) OTHER MD:   CHIEF COMPLAINT: Estrogen receptor positive breast cancer  CURRENT TREATMENT: Anastrozole   INTERVAL HISTORY:  Madison Hardin returns today for follow-up of her estrogen receptor positive breast cancer.  She continues on anastrozole, doing very well overall. Last mammogram Nov 2023 negative for malignancy. Her most recent bone density screening on 10/16/2022, T score of -1.9. She says she has lost 40% of the body weight since her last visit, she also says she has been utterly sedentary since 2020. She is here to review bone density results today via telephone visit Rest of the pertinent 10 point reviewed and negative.   COVID 19 VACCINATION STATUS: Has never had COVID.  She has had vaccines x4   HISTORY OF CURRENT ILLNESS: From the original intake note:  Madison Hardin had routine screening mammography on 04/10/2018 showing a possible abnormality in the left breast. She underwent unilateral left diagnostic mammography with tomography and left breast ultrasonography at [facility] on 04/23/2018 showing: Breast Density Category B. In the middle third of the slightly upper and outer left breast is a group of heterogeneous calcifications spanning 2.5 x 2.5 x 1.4 cm on mammography. Ultrasound of the left axilla demonstrates multiple normal-appearing lymph nodes. No suspicious masses or lymph nodes with cortical thickening are identified by ultrasonography.   Accordingly on 04/25/2018 she proceeded to biopsy of the left breast area in question. The pathology from  this procedure showed [id number]): Invasive mammary carcinoma, e-cadherin positive, grade 1 Prognostic indicators significant for: estrogen receptor, 100% positive and progesterone receptor, 100% positive, both with strong staining intensity. Proliferation marker Ki67 at 3%. HER2 negative immunohistochemical and morphometric analysis (1+).   The patient's subsequent history is as detailed below.    PAST MEDICAL HISTORY: Past Medical History:  Diagnosis Date   Arthritis    Breast cancer (HCC)    Cataract 03/2018   Surgery on both eyes in 03/2018   Family history of breast cancer    Family history of pancreatic cancer    Family history of prostate cancer    GERD (gastroesophageal reflux disease)    Hyperlipidemia    Hypertension    Malignant neoplasm of upper-outer quadrant of left breast in female, estrogen receptor positive (HCC) 05/15/2018   Pneumonia    as child    PAST SURGICAL HISTORY: Past Surgical History:  Procedure Laterality Date   ABDOMINAL HYSTERECTOMY     AUGMENTATION MAMMAPLASTY     Pt had explants   BREAST EXCISIONAL BIOPSY Left    BREAST LUMPECTOMY     BREAST LUMPECTOMY WITH RADIOACTIVE SEED AND SENTINEL LYMPH NODE BIOPSY Left 06/17/2018   Procedure: LEFT BREAST PARTIAL MASTECTOMY WITH RADIOACTIVE SEED AND SENTINEL LYMPH NODE BIOPSY;  Surgeon: Abigail Miyamoto, MD;  Location: [facility] OR;  Service: General;  Laterality: Left;   BREAST SURGERY     COLONOSCOPY     last 2010 in [place]   EYE SURGERY     cataracts   KIDNEY STONE SURGERY     flushed per pt.   POLYPECTOMY     last 2010   TONSILLECTOMY     UPPER GASTROINTESTINAL ENDOSCOPY  UTERINE FIBROID SURGERY  1980    FAMILY HISTORY Family History  Problem Relation Age of Onset   Breast cancer Mother 12       without treatment   Prostate cancer Father 57   Penile cancer Father    Leukemia Paternal Grandmother    Pancreatic cancer Paternal Aunt        dx late 81s   Colon cancer Neg Hx     Rectal cancer Neg Hx    Stomach cancer Neg Hx    Esophageal cancer Neg Hx    Colon polyps Neg Hx   The patient's father had a history of prostate and penile cancer; he died at age 59. Patients' mother died from breast cancer at age 28.  Apparently she can seal the cancer for a long time before bringing it to medical attention and then refused treatment.  The patient has no siblings. Patient denies anyone in her family having ovarian cancer. The patient's paternal aunt had pancreatic cancer.   GYNECOLOGIC HISTORY:  Menarche: 82 years old GX P: 0 LMP: No LMP recorded. Patient has had a hysterectomy. 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 She lives alone (independent living at [facility]) and has no pets. She has no children. She does not attend a church/synagogue/mosque.   ADVANCED DIRECTIVES: Her healthcare power of attorney is longtime friend Arsenio Katz, [phone].   HEALTH MAINTENANCE: Social History   Tobacco Use   Smoking status: Former    Types: Cigarettes    Quit date: 06/05/1978    Years since quitting: 44.4   Smokeless tobacco: Never  Vaping Use   Vaping Use: Never used  Substance Use Topics   Alcohol use: No   Drug use: No     Colonoscopy: 01/2018 (Dr. Rhea Belton)  PAP: >5 years ago  Bone density:11/2018, -1.4   No Known Allergies  Current Outpatient Medications  Medication Sig Dispense Refill   anastrozole (ARIMIDEX) 1 MG tablet TAKE 1 TABLET DAILY 90 tablet 3   omeprazole (PRILOSEC) 10 MG capsule Take 1 capsule (10 mg total) by mouth daily. 90 capsule 3   rosuvastatin (CRESTOR) 20 MG tablet Take 20 mg by mouth 2 (two) times a week.     No current facility-administered medications for this visit.    OBJECTIVE: White woman examined in a chair ( not the examination table). There were no vitals filed for this  visit.     There is no height or weight on file to calculate BMI.   Wt Readings from Last 3 Encounters:  04/21/22 182 lb 3.2 oz (82.6 kg)  04/21/21 201 lb 8 oz (91.4 kg)  04/20/20 199 lb 9.6 oz (90.5 kg)      ECOG FS:1 - Symptomatic but completely ambulatory   PE not done telephone visit.   LAB RESULTS:  CMP     Component Value Date/Time   NA 138 04/21/2022 1230   NA 142 04/17/2012 0000   K 4.2 04/21/2022 1230   CL 104 04/21/2022 1230   CO2 24 04/21/2022 1230   GLUCOSE 100 (H) 04/21/2022 1230   BUN 16 04/21/2022 1230   BUN 19 04/17/2012 0000   CREATININE 0.69 04/21/2022 1230   CREATININE 0.71 09/08/2016 1117   CALCIUM 10.0 04/21/2022 1230   PROT 7.2 04/21/2022 1230   ALBUMIN 4.4 04/21/2022 1230   AST 15 04/21/2022  1230   ALT 12 04/21/2022 1230   ALKPHOS 70 04/21/2022 1230   BILITOT 0.6 04/21/2022 1230   GFRNONAA >60 04/21/2022 1230   GFRAA >60 06/12/2018 1018   GFRAA >60 05/15/2018 1535    Lab Results  Component Value Date   WBC 6.6 04/21/2022   NEUTROABS 4.4 04/21/2022   HGB 14.0 04/21/2022   HCT 42.0 04/21/2022   MCV 92.5 04/21/2022   PLT 258 04/21/2022   No results found for: "LABCA2"  No components found for: "ZOXWRU045"  No results for input(s): "INR" in the last 168 hours.  No results found for: "LABCA2"  No results found for: "WUJ811"  No results found for: "CAN125"  No results found for: "CAN153"  No results found for: "CA2729"  No components found for: "HGQUANT"  No results found for: "CEA1", "CEA" / No results found for: "CEA1", "CEA"   No results found for: "AFPTUMOR"  No results found for: "CHROMOGRNA"  No results found for: "TOTALPROTELP", "ALBUMINELP", "A1GS", "A2GS", "BETS", "BETA2SER", "GAMS", "MSPIKE", "SPEI" (this displays SPEP labs)  No results found for: "KPAFRELGTCHN", "LAMBDASER", "KAPLAMBRATIO" (kappa/lambda light chains)  No results found for: "HGBA", "HGBA2QUANT", "HGBFQUANT", "HGBSQUAN" (Hemoglobinopathy  evaluation)   No results found for: "LDH"  No results found for: "IRON", "TIBC", "IRONPCTSAT" (Iron and TIBC)  No results found for: "FERRITIN"  Urinalysis No results found for: "COLORURINE", "APPEARANCEUR", "LABSPEC", "PHURINE", "GLUCOSEU", "HGBUR", "BILIRUBINUR", "KETONESUR", "PROTEINUR", "UROBILINOGEN", "NITRITE", "LEUKOCYTESUR"   STUDIES:  DG Bone Density  Result Date: 10/16/2022 EXAM: DUAL X-RAY ABSORPTIOMETRY (DXA) FOR BONE MINERAL DENSITY IMPRESSION: Referring Physician:  Rachel Moulds Your patient completed a bone mineral density test using GE Lunar iDXA system (analysis version: 16). Technologist:   lmn PATIENT: Name: Madison Hardin, Madison Hardin Patient ID:  [id number] Birth Date: 1940-06-16 Height:     61.8 in. Sex:         Female Measured:   10/16/2022 Weight:     149.4 lbs. Indications: Advanced Age, Anastrazole, Breast Cancer History, Caucasian, Estrogen Deficient, Hysterectomy, Omeprazole, Postmenopausal, Secondary Osteoporosis Fractures: None Treatments: Hormone Therapy For Cancer, Vitamin D (E933.5) ASSESSMENT: The BMD measured at Femur Neck Left is 0.768 g/cm2 with a T-score of -1.9. This patient is considered osteopenic/low bone mass according to World Health Organization [facility]) criteria. The quality of the exam is good. L3 and L4 were excluded due to degenerative changes. Site Region Measured Date Measured Age YA BMD Significant CHANGE T-score DualFemur Neck Left 10/16/2022 81.2 -1.9 0.768 g/cm2 * DualFemur Neck Left  11/18/2018    77.3         -1.4    0.837 g/cm2 AP Spine  L1-L2      10[age]16/2022    81.2         -0.3    1.127 g/cm2 AP Spine  L1-L2      11/18/2018    77.3         -0.3    1.124 g/cm2 DualFemur Total Madison Hardin 10/16/2022 81.2 -1.5 0.824 g/cm2 * DualFemur Total Madison Hardin 11/18/2018    77.3         -0.4    0.958 g/cm2 World Health Organization [facility]) criteria for post-menopausal, Caucasian Women: Normal       T-score at or above -1 SD Osteopenia   T-score between -1 and -2.5 SD Osteoporosis  T-score at or below -2.5 SD RECOMMENDATION: 1. All patients should optimize calcium and vitamin D intake. 2. Consider FDA-approved medical therapies in postmenopausal women and men aged 18 years and older, based on  the following: a. A hip or vertebral (clinical or morphometric) fracture. b. T-score = -2.5 at the femoral neck or spine after appropriate evaluation to exclude secondary causes. c. Low bone mass (T-score between -1.0 and -2.5 at the femoral neck or spine) and a 10-year probability of a hip fracture = 3% or a 10-year probability of a major osteoporosis-related fracture = 20% based on the [place]-adapted WHO algorithm. d. Clinician judgment and/or patient preferences may indicate treatment for people with 10-year fracture probabilities above or below these levels. FOLLOW-UP: Patients with diagnosis of osteoporosis or at high risk for fracture should have regular bone mineral density tests. Patients eligible for Medicare are allowed routine testing every 2 years. The testing frequency can be increased to one year for patients who have rapidly progressing disease, are receiving or discontinuing medical therapy to restore bone mass, or have additional risk factors. I have reviewed this study and agree with the findings. [facility] FRAX* 10-year Probability of Fracture Based on femoral neck BMD: DualFemur (Left) Major Osteoporotic Fracture: 15.6% Hip Fracture:                4.6% Population:                  [place] (Caucasian) Risk Factors:                Secondary Osteoporosis *FRAX is a Armed forces logistics/support/administrative officer of the [employer] of [employer] for Metabolic Bone Disease, a World Science writer (WHO) [employer]. ASSESSMENT: The probability of a major osteoporotic fracture is 15.6% within the next ten years. The probability of a hip fracture is 4.6% within the next ten years. Electronically Signed   By: Frederico Hamman M.D.   On: 10/16/2022 10:05     ELIGIBLE FOR AVAILABLE  RESEARCH PROTOCOL: no   ASSESSMENT: 82 y.o. [place], [place] woman s/p left breast upper outer quadrant biopsy 04/25/2018 for a clinical T2N0, stage IB invasive ductal carcinoma (E-cadherin positive), grade 1 estrogen and progesterone receptor positive, HER-2 not amplified, with an MIB-1 of 3%  (1) Genetic testing performed through [facility]'s Breast Cancer STAT + Common Hereditary Cancers Panel on 05/09/2018 showed no pathogenic mutations. The STAT Breast cancer panel offered by [facility] includes sequencing and rearrangement analysis for the following 9 genes:  ATM, BRCA1, BRCA2, CDH1, CHEK2, PALB2, PTEN, STK11 and TP53.  The Common Hereditary Cancers Panel offered by [facility] includes sequencing and/or deletion duplication testing of the following 47 genes: APC, ATM, AXIN2, BARD1, BMPR1A, BRCA1, BRCA2, BRIP1, CDH1, CDKN2A (p14ARF), CDKN2A (p16INK4a), CKD4, CHEK2, CTNNA1, DICER1, EPCAM (Deletion/duplication testing only), GREM1 (promoter region deletion/duplication testing only), KIT, MEN1, MLH1, MSH2, MSH3, MSH6, MUTYH, NBN, NF1, NHTL1, PALB2, PDGFRA, PMS2, POLD1, POLE, PTEN, RAD50, RAD51C, RAD51D, SDHB, SDHC, SDHD, SMAD4, SMARCA4. STK11, TP53, TSC1, TSC2, and VHL.  The following genes were evaluated for sequence changes only: SDHA and HOXB13 c.251G>A variant only.  (a) A variant of uncertain significance (VUS) in a gene called APC was also noted.  (2) status post left lumpectomy and sentinel lymph node sampling 06/19/2018 for a pT1a pN0, stage IA invasive ductal carcinoma, grade 1, with close but negative margins.  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   PLAN:  Ms. Huntyr is here for follow-up on anastrozole.  She is now about  4.5 years from definitive surgery for her breast cancer.   She is here for a telephone  visit to review bone density results. We discussed about osteopenia noted in femur neck which may have gotten worse compared to last visit. We discussed several options for treatment of osteopenia while on aromatase inhibitors. However since she will be done with AI in Jan 2025, she could elect to observe as well. She says she read about bisphosphonates and she is not particularly thrilled about the side effects and would rather choose to be monitored.  She cannot exercise much given her knee issues but lately she has been able to walk just a little bit on the treadmill and has been doing some water aerobics.  She is willing to go back on vitamin D supplementation.  I think this is a reasonable approach.  She will return to clinic in January.  She will continue bone density monitoring with her PCP after this.  Total time spent: 12 min  I connected with  Madison Hardin on 11/09/22 by a telephone application and verified that I am speaking with the correct person using two identifiers.   I discussed the limitations of evaluation and management by telemedicine. The patient expressed understanding and agreed to proceed.   *Total Encounter Time as defined by the Centers for Medicare and Medicaid Services includes, in addition to the face-to-face time of a patient visit (documented in the note above) non-face-to-face time: obtaining and reviewing outside history, ordering and reviewing medications, tests or procedures, care coordination (communications with other health care professionals or caregivers) and documentation in the medical record.

## 2023-01-04 ENCOUNTER — Other Ambulatory Visit: Payer: Self-pay | Admitting: Hematology and Oncology

## 2023-04-23 ENCOUNTER — Ambulatory Visit
Admission: RE | Admit: 2023-04-23 | Discharge: 2023-04-23 | Disposition: A | Discharge status: Home / Self Care | Payer: Medicare Other | Source: Ambulatory Visit | Attending: Hematology and Oncology | Admitting: Hematology and Oncology

## 2023-04-23 ENCOUNTER — Other Ambulatory Visit: Payer: Self-pay | Admitting: Hematology and Oncology

## 2023-04-23 DIAGNOSIS — R928 Other abnormal and inconclusive findings on diagnostic imaging of breast: Secondary | ICD-10-CM

## 2023-04-23 DIAGNOSIS — Z170 Estrogen receptor positive status [ER+]: Secondary | ICD-10-CM

## 2023-04-23 DIAGNOSIS — N6489 Other specified disorders of breast: Secondary | ICD-10-CM

## 2023-04-23 DIAGNOSIS — Z1231 Encounter for screening mammogram for malignant neoplasm of breast: Secondary | ICD-10-CM

## 2023-04-24 ENCOUNTER — Ambulatory Visit
Admission: RE | Admit: 2023-04-24 | Discharge: 2023-04-24 | Disposition: A | Discharge status: Home / Self Care | Payer: Medicare Other | Source: Ambulatory Visit | Attending: Hematology and Oncology | Admitting: Hematology and Oncology

## 2023-04-24 DIAGNOSIS — N6489 Other specified disorders of breast: Secondary | ICD-10-CM

## 2023-04-24 HISTORY — PX: BREAST BIOPSY: SHX20

## 2023-04-25 LAB — SURGICAL PATHOLOGY

## 2023-05-08 ENCOUNTER — Inpatient Hospital Stay: Payer: Medicare Other | Attending: Hematology and Oncology | Admitting: Hematology and Oncology

## 2023-05-08 VITALS — BP 145/83 | HR 94 | Temp 97.8°F | Resp 16 | Wt 134.8 lb

## 2023-05-08 DIAGNOSIS — C50412 Malignant neoplasm of upper-outer quadrant of left female breast: Secondary | ICD-10-CM | POA: Insufficient documentation

## 2023-05-08 DIAGNOSIS — Z79811 Long term (current) use of aromatase inhibitors: Secondary | ICD-10-CM | POA: Diagnosis not present

## 2023-05-08 DIAGNOSIS — Z79899 Other long term (current) drug therapy: Secondary | ICD-10-CM | POA: Insufficient documentation

## 2023-05-08 DIAGNOSIS — M810 Age-related osteoporosis without current pathological fracture: Secondary | ICD-10-CM | POA: Insufficient documentation

## 2023-05-08 DIAGNOSIS — Z170 Estrogen receptor positive status [ER+]: Secondary | ICD-10-CM | POA: Diagnosis not present

## 2023-05-08 DIAGNOSIS — Z87891 Personal history of nicotine dependence: Secondary | ICD-10-CM | POA: Diagnosis not present

## 2023-05-08 NOTE — Progress Notes (Signed)
[facility]  [phone] [phone]     ID: Madison Hardin DOB: 09-24-1940  MR#: [id number]  LKG#:[id number]  Patient Care Team: Rachel Moulds, MD as PCP - General (Hematology and Oncology) Abigail Miyamoto, MD as Consulting Physician (General Surgery) Lonie Peak, MD as Attending Physician (Radiation Oncology) Pyrtle, Carie Caddy, MD as Consulting Physician (Gastroenterology)   CHIEF COMPLAINT: Estrogen receptor positive breast cancer  CURRENT TREATMENT: Anastrozole  Discussed the use of AI scribe software for clinical note transcription with the patient, who gave verbal consent to proceed.  History of Present Illness    The patient, with a history of breast cancer, is due to complete a five-year course of anastrozole in January. She is looking forward to discontinuing the medication, which may have contributed to a decline in her bone density..  In addition to her cancer history, the patient has lost a significant amount of weight, approximately 90 pounds, with the help of Wegovy. This weight loss has led to changes in her body shape and the need for a new wardrobe. However, she is struggling with finding clothes that fit her new body shape.  The patient also had a recent breast biopsy due to an asymmetry near her lumpectomy site. The biopsy revealed a benign neuroma, an uncommon nerve sheath tumor.  The patient also has arthritis, which limits her mobility and makes exercise challenging. Despite this, she remains very ambulatory with the help of a scooter.   COVID 19 VACCINATION STATUS: Has never had COVID.  She has had vaccines x4   HISTORY OF CURRENT ILLNESS: From the original intake note:  Madison Hardin had routine screening mammography on 04/10/2018 showing a possible abnormality in the left breast. She underwent unilateral left diagnostic mammography with tomography and left breast ultrasonography at [facility] on 04/23/2018  showing: Breast Density Category B. In the middle third of the slightly upper and outer left breast is a group of heterogeneous calcifications spanning 2.5 x 2.5 x 1.4 cm on mammography. Ultrasound of the left axilla demonstrates multiple normal-appearing lymph nodes. No suspicious masses or lymph nodes with cortical thickening are identified by ultrasonography.   Accordingly on 04/25/2018 she proceeded to biopsy of the left breast area in question. The pathology from this procedure showed [id number]): Invasive mammary carcinoma, e-cadherin positive, grade 1 Prognostic indicators significant for: estrogen receptor, 100% positive and progesterone receptor, 100% positive, both with strong staining intensity. Proliferation marker Ki67 at 3%. HER2 negative immunohistochemical and morphometric analysis (1+).   The patient's subsequent history is as detailed below.    PAST MEDICAL HISTORY: Past Medical History:  Diagnosis Date   Arthritis    Breast cancer (HCC)    Cataract 03/2018   Surgery on both eyes in 03/2018   Family history of breast cancer    Family history of pancreatic cancer    Family history of prostate cancer    GERD (gastroesophageal reflux disease)    Hyperlipidemia    Hypertension    Malignant neoplasm of upper-outer quadrant of left breast in female, estrogen receptor positive (HCC) 05/15/2018   Pneumonia    as child    PAST SURGICAL HISTORY: Past Surgical History:  Procedure Laterality Date   ABDOMINAL HYSTERECTOMY     AUGMENTATION MAMMAPLASTY     Pt had explants   BREAST BIOPSY Left 04/24/2023   MM LT BREAST BX W LOC DEV 1ST LESION IMAGE BX SPEC STEREO GUIDE 04/24/2023 [facility] M[facility]MO[facility]   BREAST EXCISIONAL BIOPSY Left  BREAST LUMPECTOMY     BREAST LUMPECTOMY WITH RADIOACTIVE SEED AND SENTINEL LYMPH NODE BIOPSY Left 06/17/2018   Procedure: LEFT BREAST PARTIAL MASTECTOMY WITH RADIOACTIVE SEED AND SENTINEL LYMPH NODE BIOPSY;  Surgeon: Abigail Miyamoto, MD;   Location: [facility] OR;  Service: General;  Laterality: Left;   BREAST SURGERY     COLONOSCOPY     last 2010 in [place]   EYE SURGERY     cataracts   KIDNEY STONE SURGERY     flushed per pt.   POLYPECTOMY     last 2010   TONSILLECTOMY     UPPER GASTROINTESTINAL ENDOSCOPY     UTERINE FIBROID SURGERY  1980    FAMILY HISTORY Family History  Problem Relation Age of Onset   Breast cancer Mother 20       without treatment   Prostate cancer Father 34   Penile cancer Father    Leukemia Paternal Grandmother    Pancreatic cancer Paternal Aunt        dx late 100s   Colon cancer Neg Hx    Rectal cancer Neg Hx    Stomach cancer Neg Hx    Esophageal cancer Neg Hx    Colon polyps Neg Hx   The patient's father had a history of prostate and penile cancer; he died at age 55. Patients' mother died from breast cancer at age 87.  Apparently she can seal the cancer for a long time before bringing it to medical attention and then refused treatment.  The patient has no siblings. Patient denies anyone in her family having ovarian cancer. The patient's paternal aunt had pancreatic cancer.   GYNECOLOGIC HISTORY:  Menarche: 82 years old GX P: 0 LMP: No LMP recorded. Patient has had a hysterectomy. 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 She lives alone (independent living at [facility]) and has no pets. She has no children. She does not attend a church/synagogue/mosque.   ADVANCED DIRECTIVES: Her healthcare power of attorney is longtime friend Arsenio Katz, [phone].   HEALTH MAINTENANCE: Social History   Tobacco Use   Smoking status: Former    Current packs/day: 0.00    Types: Cigarettes    Quit date: 06/05/1978    Years since quitting: 44.9   Smokeless tobacco: Never  Vaping Use   Vaping status: Never Used  Substance Use Topics    Alcohol use: No   Drug use: No     Colonoscopy: 01/2018 (Dr. Rhea Belton)  PAP: >5 years ago  Bone density:11/2018, -1.4   No Known Allergies  Current Outpatient Medications  Medication Sig Dispense Refill   anastrozole (ARIMIDEX) 1 MG tablet TAKE 1 TABLET DAILY 90 tablet 3   omeprazole (PRILOSEC) 10 MG capsule Take 1 capsule (10 mg total) by mouth daily. 90 capsule 3   rosuvastatin (CRESTOR) 20 MG tablet Take 20 mg by mouth 2 (two) times a week.     No current facility-administered medications for this visit.    OBJECTIVE: White woman examined in a chair ( not the examination table). Vitals:   05/08/23 0935  BP: (!) 145/83  Pulse: 94  Resp: 16  Temp: 97.8 F (36.6 C)  SpO2: 98%      Body mass index is 23.88 kg/m.   Wt Readings from Last 3 Encounters:  05/08/23 134 lb 12.8 oz (61.1 kg)  04/21/22 182 lb  3.2 oz (82.6 kg)  04/21/21 201 lb 8 oz (91.4 kg)      ECOG FS:1 - Symptomatic but completely ambulatory   Physical Exam Constitutional:      Appearance: Normal appearance.  Chest:     Comments: Bilateral breasts examined. No palpable masses or regional adenopathy. Musculoskeletal:        General: No swelling.  Skin:    General: Skin is warm and dry.  Neurological:     Mental Status: She is alert.       LAB RESULTS:  CMP     Component Value Date/Time   NA 138 04/21/2022 1230   NA 142 04/17/2012 0000   K 4.2 04/21/2022 1230   CL 104 04/21/2022 1230   CO2 24 04/21/2022 1230   GLUCOSE 100 (H) 04/21/2022 1230   BUN 16 04/21/2022 1230   BUN 19 04/17/2012 0000   CREATININE 0.69 04/21/2022 1230   CREATININE 0.71 09/08/2016 1117   CALCIUM 10.0 04/21/2022 1230   PROT 7.2 04/21/2022 1230   ALBUMIN 4.4 04/21/2022 1230   AST 15 04/21/2022 1230   ALT 12 04/21/2022 1230   ALKPHOS 70 04/21/2022 1230   BILITOT 0.6 04/21/2022 1230   GFRNONAA >60 04/21/2022 1230   GFRAA >60 06/12/2018 1018   GFRAA >60 05/15/2018 1535    Lab Results  Component Value Date   WBC  6.6 04/21/2022   NEUTROABS 4.4 04/21/2022   HGB 14.0 04/21/2022   HCT 42.0 04/21/2022   MCV 92.5 04/21/2022   PLT 258 04/21/2022   No results found for: "LABCA2"  No components found for: "GNFAOZ308"  No results for input(s): "INR" in the last 168 hours.  No results found for: "LABCA2"  No results found for: "MVH846"  No results found for: "CAN125"  No results found for: "CAN153"  No results found for: "CA2729"  No components found for: "HGQUANT"  No results found for: "CEA1", "CEA" / No results found for: "CEA1", "CEA"   No results found for: "AFPTUMOR"  No results found for: "CHROMOGRNA"  No results found for: "TOTALPROTELP", "ALBUMINELP", "A1GS", "A2GS", "BETS", "BETA2SER", "GAMS", "MSPIKE", "SPEI" (this displays SPEP labs)  No results found for: "KPAFRELGTCHN", "LAMBDASER", "KAPLAMBRATIO" (kappa/lambda light chains)  No results found for: "HGBA", "HGBA2QUANT", "HGBFQUANT", "HGBSQUAN" (Hemoglobinopathy evaluation)   No results found for: "LDH"  No results found for: "IRON", "TIBC", "IRONPCTSAT" (Iron and TIBC)  No results found for: "FERRITIN"  Urinalysis No results found for: "COLORURINE", "APPEARANCEUR", "LABSPEC", "PHURINE", "GLUCOSEU", "HGBUR", "BILIRUBINUR", "KETONESUR", "PROTEINUR", "UROBILINOGEN", "NITRITE", "LEUKOCYTESUR"   STUDIES:  MM LT BREAST BX W LOC DEV 1ST LESION IMAGE BX SPEC STEREO GUIDE  Addendum Date: 04/27/2023   ADDENDUM REPORT: 04/27/2023 15:47 ADDENDUM: Pathology revealed BENIGN NEUROMA, BREAST PARENCHYMA IS NOT IDENTIFIED of the LEFT breast, upper-outer, (ribbon clip). This was found to be concordant by Dr. Annia Belt. Pathology results were discussed with the patient by telephone. The patient reported doing well after the biopsy with no tenderness at the site. Post biopsy instructions and care were reviewed and questions were answered. The patient was encouraged to call [facility] for any additional  concerns. My direct phone number was provided. The patient was asked to return for LEFT diagnostic mammography in 6 months and informed a reminder notice would be sent regarding this appointment. Pathology results reported by Rene Kocher, RN on 04/25/2023. Electronically Signed   By: Annia Belt M.D.   On: 04/27/2023 15:47   Result Date: 04/27/2023 CLINICAL DATA:  Indeterminate left breast asymmetry. EXAM: LEFT BREAST STEREOTACTIC CORE NEEDLE BIOPSY COMPARISON:  Previous exam(s). FINDINGS: The patient and I discussed the procedure of stereotactic-guided biopsy including benefits and alternatives. We discussed the high likelihood of a successful procedure. We discussed the risks of the procedure including infection, bleeding, tissue injury, clip migration, and inadequate sampling. Informed written consent was given. The usual time out protocol was performed immediately prior to the procedure. Using sterile technique and 1% Lidocaine as local anesthetic, under stereotactic guidance, a 9 gauge vacuum assisted device was used to perform core needle biopsy of asymmetry upper-outer left breast using a lateral approach. Lesion quadrant: Upper outer quadrant At the conclusion of the procedure, ribbon shaped tissue marker clip was deployed into the biopsy cavity. Follow-up 2-view mammogram was performed and dictated separately. IMPRESSION: Stereotactic-guided biopsy of left breast asymmetry. No apparent complications. Electronically Signed: By: Annia Belt M.D. On: 04/24/2023 14:58   MM CLIP PLACEMENT LEFT  Result Date: 04/24/2023 CLINICAL DATA:  Status post stereo biopsy left breast asymmetry EXAM: 3D DIAGNOSTIC LEFT MAMMOGRAM POST STEREOTACTIC BIOPSY COMPARISON:  Previous exam(s). FINDINGS: 3D Mammographic images were obtained following stereotactic guided biopsy of left breast asymmetry. The biopsy marking clip is in expected position at the site of biopsy. IMPRESSION: Appropriate positioning of the ribbon shaped  biopsy marking clip at the site of biopsy in the upper-outer left breast. Final Assessment: Post Procedure Mammograms for Marker Placement Electronically Signed   By: Annia Belt M.D.   On: 04/24/2023 15:01   MM DIAG BREAST TOMO BILATERAL  Result Date: 04/23/2023 CLINICAL DATA:  82 year old female presenting for routine annual surveillance status post left breast lumpectomy in 2020. The patient has lost 60 pounds since her last mammogram. EXAM: DIGITAL DIAGNOSTIC BILATERAL MAMMOGRAM WITH TOMOSYNTHESIS AND CAD; ULTRASOUND LEFT BREAST LIMITED TECHNIQUE: Bilateral digital diagnostic mammography and breast tomosynthesis was performed. The images were evaluated with computer-aided detection. ; Targeted ultrasound examination of the left breast was performed. COMPARISON:  Previous exam(s). ACR Breast Density Category b: There are scattered areas of fibroglandular density. FINDINGS: There is an asymmetry in the superior posterior left breast, above the site of the patient's lumpectomy. The lumpectomy site itself is stable. No suspicious calcifications, other new masses or areas of distortion are seen in the bilateral breasts. Ultrasound targeted to the upper-outer quadrant of the left breast demonstrates no suspicious sonographic abnormalities. Ultrasound of the left axilla demonstrates normal-appearing lymph nodes. IMPRESSION: 1. There is an asymmetry in the upper outer left breast without a targeted sonographic correlate. 2.  Stable left breast lumpectomy site. 3.  No evidence of left axillary lymphadenopathy. 4.  No evidence of right breast malignancy. RECOMMENDATION: Attempted stereotactic biopsy is recommended for the left breast asymmetry. I did discuss with the patient that this area may not persist at the time of biopsy in which case the biopsy would be canceled and a six-month follow-up left breast mammogram would be performed. I have discussed the findings and recommendations with the patient. If applicable,  a reminder letter will be sent to the patient regarding the next appointment. BI-RADS CATEGORY  4: Suspicious. Electronically Signed   By: Frederico Hamman M.D.   On: 04/23/2023 11:18   [place] LIMITED ULTRASOUND INCLUDING AXILLA LEFT BREAST   Result Date: 04/23/2023 CLINICAL DATA:  82 year old female presenting for routine annual surveillance status post left breast lumpectomy in 2020. The patient has lost 60 pounds since her last mammogram. EXAM: DIGITAL DIAGNOSTIC BILATERAL MAMMOGRAM WITH TOMOSYNTHESIS AND CAD; ULTRASOUND LEFT  BREAST LIMITED TECHNIQUE: Bilateral digital diagnostic mammography and breast tomosynthesis was performed. The images were evaluated with computer-aided detection. ; Targeted ultrasound examination of the left breast was performed. COMPARISON:  Previous exam(s). ACR Breast Density Category b: There are scattered areas of fibroglandular density. FINDINGS: There is an asymmetry in the superior posterior left breast, above the site of the patient's lumpectomy. The lumpectomy site itself is stable. No suspicious calcifications, other new masses or areas of distortion are seen in the bilateral breasts. Ultrasound targeted to the upper-outer quadrant of the left breast demonstrates no suspicious sonographic abnormalities. Ultrasound of the left axilla demonstrates normal-appearing lymph nodes. IMPRESSION: 1. There is an asymmetry in the upper outer left breast without a targeted sonographic correlate. 2.  Stable left breast lumpectomy site. 3.  No evidence of left axillary lymphadenopathy. 4.  No evidence of right breast malignancy. RECOMMENDATION: Attempted stereotactic biopsy is recommended for the left breast asymmetry. I did discuss with the patient that this area may not persist at the time of biopsy in which case the biopsy would be canceled and a six-month follow-up left breast mammogram would be performed. I have discussed the findings and recommendations with the patient. If applicable,  a reminder letter will be sent to the patient regarding the next appointment. BI-RADS CATEGORY  4: Suspicious. Electronically Signed   By: Frederico Hamman M.D.   On: 04/23/2023 11:18     ELIGIBLE FOR AVAILABLE RESEARCH PROTOCOL: no   ASSESSMENT: 82 y.o. [place],  woman s/p left breast upper outer quadrant biopsy 04/25/2018 for a clinical T2N0, stage IB invasive ductal carcinoma (E-cadherin positive), grade 1 estrogen and progesterone receptor positive, HER-2 not amplified, with an MIB-1 of 3%  (1) Genetic testing performed through [facility]'s Breast Cancer STAT + Common Hereditary Cancers Panel on 05/09/2018 showed no pathogenic mutations. The STAT Breast cancer panel offered by [facility] includes sequencing and rearrangement analysis for the following 9 genes:  ATM, BRCA1, BRCA2, CDH1, CHEK2, PALB2, PTEN, STK11 and TP53.  The Common Hereditary Cancers Panel offered by [facility] includes sequencing and/or deletion duplication testing of the following 47 genes: APC, ATM, AXIN2, BARD1, BMPR1A, BRCA1, BRCA2, BRIP1, CDH1, CDKN2A (p14ARF), CDKN2A (p16INK4a), CKD4, CHEK2, CTNNA1, DICER1, EPCAM (Deletion/duplication testing only), GREM1 (promoter region deletion/duplication testing only), KIT, MEN1, MLH1, MSH2, MSH3, MSH6, MUTYH, NBN, NF1, NHTL1, PALB2, PDGFRA, PMS2, POLD1, POLE, PTEN, RAD50, RAD51C, RAD51D, SDHB, SDHC, SDHD, SMAD4, SMARCA4. STK11, TP53, TSC1, TSC2, and VHL.  The following genes were evaluated for sequence changes only: SDHA and HOXB13 c.251G>A variant only.  (a) A variant of uncertain significance (VUS) in a gene called APC was also noted.  (2) status post left lumpectomy and sentinel lymph node sampling 06/19/2018 for a pT1a pN0, stage IA invasive ductal carcinoma, grade 1, with close but negative margins.  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   PLAN:  Ms. Murrell is here for follow-up on anastrozole.  She is now about 5 years from definitive surgery for her breast cancer.    Breast Cancer Completion of 5-year course of Anastrozole in January 2025. Discussed potential improvement in bone density after discontinuation of Anastrozole. Recent benign breast biopsy revealing neuroma. -Discontinue Anastrozole at the end of January 2025. -Continue annual screening mammograms.  Osteoporosis Discussed potential improvement in bone density after discontinuation of  Anastrozole. -Encouraged weight-bearing exercises as tolerated.  Weight Loss Significant weight loss (90 lbs) with ZOXWRU. -Encouraged muscle toning exercises to address changes in body shape.  Follow-up As needed, with annual screening mammograms ordered by primary care physician.   *Total Encounter Time as defined by the Centers for Medicare and Medicaid Services includes, in addition to the face-to-face time of a patient visit (documented in the note above) non-face-to-face time: obtaining and reviewing outside history, ordering and reviewing medications, tests or procedures, care coordination (communications with other health care professionals or caregivers) and documentation in the medical record.

## 2023-05-21 NOTE — Progress Notes (Unsigned)
Referring-Praveena Iruku MD Reason for referral-hypertension  HPI: 82 year old female for evaluation of hypertension at request of Rachel Moulds MD.  Echocardiogram August 2016 showed normal LV function, grade 1 diastolic dysfunction, mild mitral regurgitation.  Calcium score August 2016 29 which was 41st percentile.  Current Outpatient Medications  Medication Sig Dispense Refill   anastrozole (ARIMIDEX) 1 MG tablet TAKE 1 TABLET DAILY 90 tablet 3   omeprazole (PRILOSEC) 10 MG capsule Take 1 capsule (10 mg total) by mouth daily. 90 capsule 3   rosuvastatin (CRESTOR) 20 MG tablet Take 20 mg by mouth 2 (two) times a week.     No current facility-administered medications for this visit.    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   Pneumonia    as child    Past Surgical History:  Procedure Laterality Date   ABDOMINAL HYSTERECTOMY     AUGMENTATION MAMMAPLASTY     Pt had explants   BREAST BIOPSY Left 04/24/2023   MM LT BREAST BX W LOC DEV 1ST LESION IMAGE BX SPEC STEREO GUIDE 04/24/2023 [facility]   BREAST EXCISIONAL BIOPSY Left    BREAST LUMPECTOMY     BREAST LUMPECTOMY WITH RADIOACTIVE SEED AND SENTINEL LYMPH NODE BIOPSY Left 06/17/2018   Procedure: LEFT BREAST PARTIAL MASTECTOMY WITH RADIOACTIVE SEED AND SENTINEL LYMPH NODE BIOPSY;  Surgeon: Abigail Miyamoto, MD;  Location: [facility] OR;  Service: General;  Laterality: Left;   BREAST SURGERY     COLONOSCOPY     last 2010 in [place]   EYE SURGERY     cataracts   KIDNEY STONE SURGERY     flushed per pt.    POLYPECTOMY     last 2010   TONSILLECTOMY     UPPER GASTROINTESTINAL ENDOSCOPY     UTERINE FIBROID SURGERY  1980    Social History   Socioeconomic History   Marital status: Single    Spouse name: Not on file   Number of children: 0   Years of education: Not on file   Highest education level: Not on file  Occupational History   Occupation: Retired  Tobacco Use   Smoking status: Former    Current packs/day: 0.00    Types: Cigarettes    Quit date: 06/05/1978    Years since quitting: 44.9   Smokeless tobacco: Never  Vaping Use   Vaping status: Never Used  Substance and Sexual Activity   Alcohol use: No   Drug use: No   Sexual activity: Not on file  Other Topics Concern   Not on file  Social History Narrative   Not on file   Social Drivers of Health   Financial Resource Strain: Low Risk  (01/11/2021)   [facility] visits prior to 08/05/2022., [facility] visits prior to 08/05/2022.   Overall Financial Resource Strain (CARDIA)    Difficulty of Paying Living Expenses: Not hard at all  Food Insecurity: No Food Insecurity (03/13/2022)   [facility] to [facility]05/2022., [facility] to 08/05/2022[facility]   Hunger Vital Sign    Worried About Running Out of Food in the Last Year: Never true    Ran Out of Food in the Last Year: Never true  Transportation Needs: No Transportation Needs (03/13/2022)   [facility], [facility] prior to 08/05/2022[facility] visits prior to 08/05/2022.   PRAPARE - Administrator, Civil Service (Medical): No    Lack of Transportation (Non-Medical): No  Physical Activity: Low Risk  (03/02/2022)   [facility], [facility]   PCARE Exercise SDOH    Exercise: Aerobic    PCare  Exercise SDOH: Not on file    PCare Exercise SDOH: Not on file  Stress: No Stress Concern Present (01/11/2021)   [facility] prior to 08/05/2022., [facility] visits prior to 08/05/2022.   [employer] of Occupational Health - Occupational Stress Questionnaire    Feeling of Stress : Not at all  Social Connections: Socially Isolated (01/11/2021)   [facility] prior to 08/05/2022., [facility] visits prior to 08/05/2022.   Social Advertising account executive [NHANES]    Frequency of Communication with Friends and Family: More than three times a week    Frequency of Social Gatherings with Friends and Family: More than three times a week    Attends Religious Services: Never    Database administrator or Organizations: No    Attends Banker Meetings: Never    Marital Status: Divorced  Catering manager Violence: Not At Risk (01/11/2021)   [facility] prior to 08/05/2022., [facility] visits prior to 08/05/2022.   Humiliation, Afraid, Rape, and Kick questionnaire    Fear of Current or Ex-Partner: No    Emotionally Abused: No    Physically Abused: No    Sexually Abused: No    Family History  Problem Relation Age of Onset   Breast cancer Mother 33       without treatment   Prostate cancer Father 4   Penile cancer Father    Leukemia Paternal Grandmother    Pancreatic cancer Paternal Aunt        dx late 36s   Colon cancer Neg Hx    Rectal cancer Neg Hx    Stomach cancer Neg Hx    Esophageal cancer Neg Hx    Colon polyps Neg Hx     ROS: no fevers or chills, productive cough, hemoptysis, dysphasia, odynophagia, melena, hematochezia, dysuria, hematuria, rash, seizure activity, orthopnea, PND, pedal edema, claudication. Remaining systems are negative.  Physical Exam:   There were no vitals  taken for this visit.  General:  Well developed/well nourished in NAD Skin warm/dry Patient not depressed No peripheral clubbing Back-normal HEENT-normal/normal eyelids Neck supple/normal carotid upstroke bilaterally; no bruits; no JVD; no thyromegaly chest - CTA/ normal expansion CV - RRR/normal S1 and S2; no murmurs, rubs or gallops;  PMI nondisplaced Abdomen -NT/ND, no HSM, no mass, + bowel sounds, no bruit 2+ femoral pulses, no bruits Ext-no edema, chords, 2+ DP Neuro-grossly nonfocal  ECG - personally reviewed  A/P  1 hypertension-  2 hyperlipidemia-  3 coronary calcification-  Arlys John  Jens Som, MD

## 2023-05-23 ENCOUNTER — Encounter: Payer: Self-pay | Admitting: Cardiology

## 2023-05-23 ENCOUNTER — Ambulatory Visit: Payer: Medicare Other | Attending: Cardiology | Admitting: Cardiology

## 2023-05-23 VITALS — BP 118/78 | HR 97 | Ht 62.0 in | Wt 134.1 lb

## 2023-05-23 DIAGNOSIS — I2510 Atherosclerotic heart disease of native coronary artery without angina pectoris: Secondary | ICD-10-CM

## 2023-05-23 DIAGNOSIS — E785 Hyperlipidemia, unspecified: Secondary | ICD-10-CM

## 2023-05-23 DIAGNOSIS — I10 Essential (primary) hypertension: Secondary | ICD-10-CM

## 2023-05-23 DIAGNOSIS — Z09 Encounter for follow-up examination after completed treatment for conditions other than malignant neoplasm: Secondary | ICD-10-CM | POA: Diagnosis not present

## 2023-05-23 DIAGNOSIS — Z136 Encounter for screening for cardiovascular disorders: Secondary | ICD-10-CM | POA: Diagnosis present

## 2023-05-23 MED ORDER — ROSUVASTATIN CALCIUM 20 MG PO TABS: 20.0000 mg | ORAL_TABLET | Freq: Every day | ORAL | 3 refills | Status: DC

## 2023-05-23 NOTE — Patient Instructions (Signed)
Medication Instructions:   INCREASE ROSUVASTATIN TO 10 MG ONCE DAILY  *If you need a refill on your cardiac medications before your next appointment, please call your pharmacy*   Lab Work:  Your physician recommends that you return for lab work in: [facility]  If you have labs (blood work) drawn today and your tests are completely normal, you will receive your results only by: MyChart Message (if you have MyChart) OR A paper copy in the mail If you have any lab test that is abnormal or we need to change your treatment, we will call you to review the results.   Testing/Procedures:  CORONARY CALCIUM SCORING CT SCAN AT THE [facility]   Follow-Up: At [facility], you and your health needs are our priority.  As part of our continuing mission to provide you with exceptional heart care, we have created designated Provider Care Teams.  These Care Teams include your primary Cardiologist (physician) and Advanced Practice Providers (APPs -  Physician Assistants and Nurse Practitioners) who all work together to provide you with the care you need, when you need it.   Your next appointment:   12 month(s)  Provider:   Olga Millers, MD

## 2023-05-24 ENCOUNTER — Encounter: Payer: Self-pay | Admitting: Cardiology

## 2023-05-24 ENCOUNTER — Telehealth: Payer: Self-pay | Admitting: Cardiology

## 2023-05-24 DIAGNOSIS — E785 Hyperlipidemia, unspecified: Secondary | ICD-10-CM

## 2023-05-24 NOTE — Telephone Encounter (Signed)
Called and spoke to patient. Verified name and DOB. Patient was seen in the office yesterday by Dr Jens Som and called to get clarification on her Rosuvastatin. She said she was previously on 10 mg 2 times a week and she thought Dr Jens Som was increasing it to 10 mg daily. In the office visit note Dr Jens Som stated increase Rosuvastatin 20 mg daily. The [facility] has Rosuvastatin 10 mg daily. She is calling because the pharmacy is in the process of shipping 20 mg tablets. Please advise.

## 2023-05-24 NOTE — Telephone Encounter (Signed)
Please see telephone call . Patient sent  dual  message MyChart and telephone call.  Encounter closed

## 2023-05-24 NOTE — Telephone Encounter (Signed)
Pt c/o medication issue:  1. Name of Medication: rosuvastatin (CRESTOR) 20 MG tablet   2. How are you currently taking this medication (dosage and times per day)?   3. Are you having a reaction (difficulty breathing--STAT)?   4. What is your medication issue?  Patient is requesting call back to get clarification on this medication. She states that the after care instructions and what she was instructed, was to take 10 mg of this medication. Please advise.

## 2023-05-25 ENCOUNTER — Other Ambulatory Visit: Payer: Self-pay | Admitting: Cardiology

## 2023-05-25 DIAGNOSIS — E785 Hyperlipidemia, unspecified: Secondary | ICD-10-CM

## 2023-05-25 MED ORDER — ROSUVASTATIN CALCIUM 20 MG PO TABS: 20.0000 mg | ORAL_TABLET | Freq: Every day | ORAL | 3 refills | Status: DC

## 2023-05-25 NOTE — Telephone Encounter (Signed)
Unable to reach pt or leave a message mailbox is full 

## 2023-06-01 ENCOUNTER — Other Ambulatory Visit: Payer: Self-pay | Admitting: *Deleted

## 2023-06-01 DIAGNOSIS — E785 Hyperlipidemia, unspecified: Secondary | ICD-10-CM

## 2023-06-01 MED ORDER — ROSUVASTATIN CALCIUM 10 MG PO TABS: 10.0000 mg | ORAL_TABLET | Freq: Every day | ORAL | 3 refills | Status: DC

## 2023-06-01 NOTE — Telephone Encounter (Signed)
Spoke with pt, Aware of dr crenshaw's recommendations. New script sent to the pharmacy  

## 2023-06-11 ENCOUNTER — Ambulatory Visit (HOSPITAL_BASED_OUTPATIENT_CLINIC_OR_DEPARTMENT_OTHER)
Admission: RE | Admit: 2023-06-11 | Discharge: 2023-06-11 | Disposition: A | Discharge status: Home / Self Care | Payer: Self-pay | Source: Ambulatory Visit | Attending: Cardiology | Admitting: Cardiology

## 2023-06-11 DIAGNOSIS — Z136 Encounter for screening for cardiovascular disorders: Secondary | ICD-10-CM | POA: Insufficient documentation

## 2023-06-19 ENCOUNTER — Telehealth: Payer: Self-pay | Admitting: *Deleted

## 2023-06-19 DIAGNOSIS — E785 Hyperlipidemia, unspecified: Secondary | ICD-10-CM

## 2023-06-19 MED ORDER — ROSUVASTATIN CALCIUM 40 MG PO TABS: 40.0000 mg | ORAL_TABLET | Freq: Every day | ORAL | Status: DC

## 2023-06-19 MED ORDER — ASPIRIN 81 MG PO TBEC: 81.0000 mg | DELAYED_RELEASE_TABLET | Freq: Every day | ORAL | Status: AC

## 2023-06-19 NOTE — Addendum Note (Signed)
 Addended by: Freddi Starr on: 06/19/2023 09:19 AM   Modules accepted: Orders

## 2023-06-19 NOTE — Telephone Encounter (Signed)
 Spoke with pt, Aware of dr Ludwig Clarks recommendations.  She has a 90 day supply of both 10 and 20 mg tablets. She will finish what she has prior to sending in the script. Lab orders mailed to the pt

## 2023-09-17 ENCOUNTER — Encounter: Payer: Self-pay | Admitting: Hematology and Oncology

## 2023-09-17 ENCOUNTER — Other Ambulatory Visit: Payer: Self-pay | Admitting: Hematology and Oncology

## 2023-09-17 DIAGNOSIS — N6489 Other specified disorders of breast: Secondary | ICD-10-CM

## 2023-09-26 ENCOUNTER — Encounter: Payer: Self-pay | Admitting: *Deleted

## 2023-09-26 DIAGNOSIS — E785 Hyperlipidemia, unspecified: Secondary | ICD-10-CM

## 2023-09-26 MED ORDER — EZETIMIBE 10 MG PO TABS: 10.0000 mg | ORAL_TABLET | Freq: Every day | ORAL | 3 refills | Status: AC

## 2023-09-26 MED ORDER — ROSUVASTATIN CALCIUM 40 MG PO TABS: 40.0000 mg | ORAL_TABLET | Freq: Every day | ORAL | Status: DC

## 2023-09-26 NOTE — Addendum Note (Signed)
 Addended by: Render Carrie on: 09/26/2023 03:28 PM   Modules accepted: Orders

## 2023-10-01 MED ORDER — ROSUVASTATIN CALCIUM 40 MG PO TABS: 40.0000 mg | ORAL_TABLET | Freq: Every day | ORAL | Status: DC

## 2023-10-01 NOTE — Addendum Note (Signed)
 Addended by: Render Carrie on: 10/01/2023 05:14 PM   Modules accepted: Orders

## 2023-10-05 MED ORDER — ROSUVASTATIN CALCIUM 40 MG PO TABS: 40.0000 mg | ORAL_TABLET | Freq: Every day | ORAL | 3 refills | Status: AC

## 2023-10-05 NOTE — Addendum Note (Signed)
 Addended by: Render Carrie on: 10/05/2023 10:30 AM   Modules accepted: Orders

## 2023-10-19 ENCOUNTER — Other Ambulatory Visit: Payer: Self-pay | Admitting: Hematology and Oncology

## 2023-10-19 DIAGNOSIS — N6489 Other specified disorders of breast: Secondary | ICD-10-CM

## 2023-10-23 ENCOUNTER — Ambulatory Visit
Admission: RE | Admit: 2023-10-23 | Discharge: 2023-10-23 | Disposition: A | Discharge status: Home / Self Care | Source: Ambulatory Visit | Attending: Hematology and Oncology | Admitting: Hematology and Oncology

## 2023-10-23 DIAGNOSIS — N6489 Other specified disorders of breast: Secondary | ICD-10-CM

## 2023-12-13 DIAGNOSIS — Z0289 Encounter for other administrative examinations: Secondary | ICD-10-CM

## 2023-12-17 ENCOUNTER — Ambulatory Visit (INDEPENDENT_AMBULATORY_CARE_PROVIDER_SITE_OTHER): Admitting: Bariatrics

## 2023-12-17 ENCOUNTER — Encounter: Payer: Self-pay | Admitting: Bariatrics

## 2023-12-17 VITALS — BP 122/74 | HR 82 | Temp 97.8°F | Ht 61.0 in | Wt 140.0 lb

## 2023-12-17 DIAGNOSIS — E7849 Other hyperlipidemia: Secondary | ICD-10-CM

## 2023-12-17 DIAGNOSIS — Z6826 Body mass index (BMI) 26.0-26.9, adult: Secondary | ICD-10-CM

## 2023-12-17 DIAGNOSIS — E669 Obesity, unspecified: Secondary | ICD-10-CM

## 2023-12-17 DIAGNOSIS — E785 Hyperlipidemia, unspecified: Secondary | ICD-10-CM | POA: Diagnosis not present

## 2023-12-17 DIAGNOSIS — Z8639 Personal history of other endocrine, nutritional and metabolic disease: Secondary | ICD-10-CM

## 2023-12-17 NOTE — Progress Notes (Signed)
 Office: [phone]  /  Fax: [phone]   Initial Visit  Madison Hardin was seen in clinic today to evaluate for obesity. She is interested in losing weight to improve overall health and reduce the risk of weight related complications. She presents today to review program treatment options, initial physical assessment, and evaluation.     She was referred by: Self-Referral  When asked what else they would like to accomplish? She states: Adopt a healthier eating pattern and lifestyle, Improve energy levels and physical activity, Improve existing medical conditions, and Improve quality of life. She uses a scooter. She does exercise in a pool, and recumbent equipment.  She states that she has had longstanding obesity and her weight at 1 time was over 300 pounds.  She has worked on her diet and exercise intermittently over time she states that she was on Wegovy from approximately September 2023 to December 2024 and that she did well on the [facility] and that it stopped her food noise.  She states that since stopping the [facility] initially she did well and her hunger was moderately controlled but then after about 2 to 3 months her hunger came back and she is currently having food noise with some stress eating.  When asked how has your weight affected you? She states: Having fatigue and Having poor endurance  Some associated conditions: Hyperlipidemia and Other: breast cancer and arthritis, and hand tremor.   Contributing factors: family history of obesity, reduced physical activity, chronic skipping of meals, menopause, and slow metabolism for age  Weight promoting medications identified: None  Current nutrition plan: Low-carb, Portion control / smart choices, and Journaling  Current level of physical activity: Limited due to chronic pain or orthopedic problems  Current or previous pharmacotherapy: GLP-1 and Phentermine  (Wegovy up to 1.7 mg)  Response to medication: Was cost  prohibitive or lost coverage for AOM   Past medical history includes:   Past Medical History:  Diagnosis Date   Arthritis    Breast cancer (HCC)    Cataract 03/2018   Surgery on both eyes in 03/2018   Family history of breast cancer    Family history of pancreatic cancer    Family history of prostate cancer    GERD (gastroesophageal reflux disease)    Hyperlipidemia    Hypertension    Malignant neoplasm of upper-outer quadrant of left breast in female, 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  Peak Weight:303 lbs , Body Fat%:43.1, Visceral Fat Rating:12, Weight trend over the last 12 months: Decreasing  General:  Alert, oriented and cooperative. Patient is in no acute distress.  Respiratory: Normal respiratory effort, no problems with respiration noted  Extremities: Normal range of motion.    Mental Status: Normal mood and affect. Normal behavior. Normal judgment and thought content.   DIAGNOSTIC DATA REVIEWED:  BMET    Component Value Date/Time   NA 138 04/21/2022 1230   NA 142 04/17/2012 0000   K 4.2 04/21/2022 1230   CL 104 04/21/2022 1230   CO2 24 04/21/2022 1230   GLUCOSE 100 (H) 04/21/2022 1230   BUN 16  04/21/2022 1230   BUN 19 04/17/2012 0000   CREATININE 0.69 04/21/2022 1230   CREATININE 0.71 09/08/2016 1117   CALCIUM  10.0 04/21/2022 1230   GFRNONAA >60 04/21/2022 1230   GFRAA >60 06/12/2018 1018   GFRAA >60 05/15/2018 1535   Lab Results  Component Value Date   HGBA1C 5.3 09/08/2016   HGBA1C 5.6 08/04/2013   No results found for: INSULIN CBC    Component Value Date/Time   WBC 6.6 04/21/2022 1230   WBC 6.9 06/12/2018 1018   RBC 4.54 04/21/2022 1230   HGB 14.0 04/21/2022 1230   HCT 42.0 04/21/2022 1230    PLT 258 04/21/2022 1230   MCV 92.5 04/21/2022 1230   MCH 30.8 04/21/2022 1230   MCHC 33.3 04/21/2022 1230   RDW 13.7 04/21/2022 1230   Iron/TIBC/Ferritin/ %Sat No results found for: IRON, TIBC, FERRITIN, IRONPCTSAT Lipid Panel     Component Value Date/Time   CHOL 272 (H) 09/08/2016 1117   TRIG 90 09/08/2016 1117   HDL 58 09/08/2016 1117   CHOLHDL 4.7 09/08/2016 1117   CHOLHDL 5.2 08/04/2013 1359   VLDL 12 08/04/2013 1359   LDLCALC 194 (H) 09/08/2016 1117   Hepatic Function Panel     Component Value Date/Time   PROT 7.2 04/21/2022 1230   ALBUMIN 4.4 04/21/2022 1230   AST 15 04/21/2022 1230   ALT 12 04/21/2022 1230   ALKPHOS 70 04/21/2022 1230   BILITOT 0.6 04/21/2022 1230      Component Value Date/Time   TSH 1.21 09/08/2016 1117     Assessment and Plan:   Hyperlipidemia LDL is not at goal. Medication(s): Zetia , Crestor  Cardiovascular risk factors: advanced age (older than 87 for men, 54 for women), dyslipidemia, obesity (BMI >= 30 kg/m2), and sedentary lifestyle  Lab Results  Component Value Date   CHOL 272 (H) 09/08/2016   HDL 58 09/08/2016   LDLCALC 194 (H) 09/08/2016   TRIG 90 09/08/2016   CHOLHDL 4.7 09/08/2016   Lab Results  Component Value Date   ALT 12 04/21/2022   AST 15 04/21/2022   ALKPHOS 70 04/21/2022   BILITOT 0.6 04/21/2022   The ASCVD Risk score (Arnett DK, et al., 2019) failed to calculate for the following reasons:   The 2019 ASCVD risk score is only valid for ages 87 to 57  Plan:  Continue Zetia  Will avoid all trans fats.  Will read labels Will minimize saturated fats except the following: low fat meats in moderation, diary, and limited dark chocolate.  Increase Omega 3 in foods, and consider an Omega 3 supplement.  She will begin the plan.  I told her that if we start her back on the [facility] or Zepbound that she would not be eligible for her insurance to cover this and it would be self-pay. Also told her that she does not  currently meet the criteria for weight loss medicines at 26.45 but we could consider putting her on the medication at least temporarily because of her history of obesity.  Secondary to her age, we discussed that we will have to be really careful that she not lose muscle mass and she will have to start doing more exercise that involves multiple muscle groups. Also discussed with her that I would not want her to go below a BMI of 24 and ideally not a BMI of less than 25.      Generalized Obesity: Current BMI 26.45    Obesity Treatment / Action Plan:  Patient will work on  garnering support from family and friends to begin weight loss journey. Will work on eliminating or reducing the presence of highly palatable, calorie dense foods in the home. Will complete provided nutritional and psychosocial assessment questionnaire before the next appointment. Will be scheduled for indirect calorimetry to determine resting energy expenditure in a fasting state.  This will allow us  to create a reduced calorie, high-protein meal plan to promote loss of fat mass while preserving muscle mass. Counseled on the health benefits of losing 5%-15% of total body weight. Was counseled on nutritional approaches to weight loss and benefits of reducing processed foods and consuming plant-based foods and high quality protein as part of nutritional weight management. Was counseled on pharmacotherapy and role as an adjunct in weight management.   Obesity Education Performed Today:  She was weighed on the bioimpedance scale and results were discussed and documented in the synopsis.  We discussed obesity as a disease and the importance of a more detailed evaluation of all the factors contributing to the disease.  We discussed the importance of long term lifestyle changes which include nutrition, exercise and behavioral modifications as well as the importance of customizing this to her specific health and social needs.  We  discussed the benefits of reaching a healthier weight to alleviate the symptoms of existing conditions and reduce the risks of the biomechanical, metabolic and psychological effects of obesity.  Discussed New Patient/Late Arrival, and Cancellation Policies. Patient voiced understanding and allowed to ask questions.   Madison Hardin appears to be in the action stage of change and states they are ready to start intensive lifestyle modifications and behavioral modifications.  44 minutes was spent today on this visit including the above counseling, pre-visit chart review, and post-visit documentation.  Reviewed by clinician on day of visit: allergies, medications, problem list, medical history, surgical history, family history, social history, and previous encounter notes.    Jeanice Dempsey A. Delores CORDOBAO.

## 2023-12-25 ENCOUNTER — Encounter: Payer: Self-pay | Admitting: *Deleted

## 2024-01-01 ENCOUNTER — Encounter: Payer: Self-pay | Admitting: Bariatrics

## 2024-01-08 ENCOUNTER — Encounter: Payer: Self-pay | Admitting: Bariatrics

## 2024-01-08 ENCOUNTER — Ambulatory Visit: Admitting: Bariatrics

## 2024-01-08 VITALS — BP 137/77 | HR 68 | Ht 61.0 in | Wt 140.0 lb

## 2024-01-08 DIAGNOSIS — E669 Obesity, unspecified: Secondary | ICD-10-CM | POA: Diagnosis not present

## 2024-01-08 DIAGNOSIS — R0602 Shortness of breath: Secondary | ICD-10-CM | POA: Diagnosis not present

## 2024-01-08 DIAGNOSIS — Z8639 Personal history of other endocrine, nutritional and metabolic disease: Secondary | ICD-10-CM

## 2024-01-08 DIAGNOSIS — E559 Vitamin D deficiency, unspecified: Secondary | ICD-10-CM

## 2024-01-08 DIAGNOSIS — G4700 Insomnia, unspecified: Secondary | ICD-10-CM

## 2024-01-08 DIAGNOSIS — R5383 Other fatigue: Secondary | ICD-10-CM | POA: Diagnosis not present

## 2024-01-08 DIAGNOSIS — E7849 Other hyperlipidemia: Secondary | ICD-10-CM | POA: Diagnosis not present

## 2024-01-08 DIAGNOSIS — Z1331 Encounter for screening for depression: Secondary | ICD-10-CM

## 2024-01-08 DIAGNOSIS — Z0000 Encounter for general adult medical examination without abnormal findings: Secondary | ICD-10-CM

## 2024-01-08 DIAGNOSIS — Z6826 Body mass index (BMI) 26.0-26.9, adult: Secondary | ICD-10-CM

## 2024-01-08 NOTE — Progress Notes (Signed)
 At a Glance:  Vitals Temp: -- (Could not obtain) BP: 137/77 Pulse Rate: 68 SpO2: 98 %   Anthropometric Measurements Height: 5' 1 (1.549 m) Weight: 140 lb (63.5 kg) BMI (Calculated): 26.47 Starting Weight: 140lb Peak Weight: 303lb   Body Composition  Body Fat %: 42.9 % Fat Mass (lbs): 60.4 lbs Muscle Mass (lbs): 76 lbs Total Body Water (lbs): 62.4 lbs Visceral Fat Rating : 12   Other Clinical Data RMR: 1555 Fasting: yes Labs: yes Today's Visit #: 1 Starting Date: 01/08/24    EKG: Normal sinus rhythm, rate 73.  Indirect Calorimeter:   Resting Metabolic Rate ( RMR):  RMR (actual): 1555 kcal RMR (calculated): 1130 kcal The calculated basal metabolic rate is 8,869 kcal thus her basal metabolic rate is better than expected.  Plan:   Indirect calorimeter completed, interpreted and reviewed with patient today and allowed to ask questions.  Discussed the implications for the chosen plan and exercise based on the RMR reading.  Will consider repeating the RMR in the future based on weight loss.    Chief Complaint:  Obesity   Subjective:  Madison Hardin (MR# [id number]) is a 83 y.o. female who presents for evaluation and treatment of obesity and related comorbidities.   Madison Hardin is currently in the action stage of change and ready to dedicate time achieving and maintaining a healthier weight. Madison Hardin is interested in becoming our patient and working on intensive lifestyle modifications including (but not limited to) diet and exercise for weight loss.  Madison Hardin has been struggling with her weight. She has been unsuccessful in either losing weight, maintaining weight loss, or reaching her healthy weight goal.  Madison Hardin's habits were reviewed today and are as follows: she started gaining weight in her 40's , she snacks frequently in the evenings, she frequently makes poor food choices, she has problems with excessive hunger, and she has binge eating behaviors.  Current or  previous pharmacotherapy: GLP-1, She was on [facility] for 1 and 1/2 years and stopped about 7 to 8 months.   Response to medication: Lost weight and was continuing until 12/24. She was on 1.7 mg of Wegovy.   Other Fatigue Madison Hardin admits to daytime somnolence and admits to waking up still tired. Patient has a history of symptoms of daytime fatigue. Madison Hardin generally gets 7 hours of sleep per night, and states that she has difficulty falling back asleep if awakened. Snoring may or may not be  present. Apneic episodes are not present. Epworth Sleepiness Score is 0.   Shortness of Breath Madison Hardin notes increasing shortness of breath with exercising and seems to be worsening over time with weight gain. She notes getting out of breath sooner with activity than she used to. This has gotten worse recently. Madison Hardin denies shortness of breath at rest or orthopnea.  Depression Screen Madison Hardin's Food and Mood (modified PHQ-9) score was 6. 5-9 mild depression     03/01/2017   11:18 AM  Depression screen PHQ 2/9  Decreased Interest 0  Down, Depressed, Hopeless 0  PHQ - 2 Score 0  Altered sleeping 3  Tired, decreased energy 3  Change in appetite 1  Feeling bad or failure about yourself  0  Trouble concentrating 0  Moving slowly or fidgety/restless 0  Suicidal thoughts 0  PHQ-9 Score 7     Assessment and Plan:   Other Fatigue Madison Hardin does feel that her weight is causing her energy to be lower than it should be. Fatigue may be related to  obesity, depression or many other causes. Labs will be ordered, and in the meanwhile, Madison Hardin will focus on self care including making healthy food choices, increasing physical activity and focusing on stress reduction.  Shortness of Breath Madison Hardin does feel that she gets out of breath more easily that she used to when she exercises. Madison Hardin's shortness of breath appears to be obesity related and exercise induced. She has agreed to work on weight loss and gradually increase exercise to  treat her exercise induced shortness of breath. Will continue to monitor closely.  Health Maintenance:   Obesity   Plan: Will do EKG, indirect calorimetry, and labs.   I discussed the importance of her retaining her muscle mass secondary to her somewhat lower BMI.  I  talked about weight loss and that she should not lose any more than 5 to 9 pounds approximately.  I also discussed that someone in her age group should not go below a BMI of 25.  I discussed that she could try water aerobics walking and some light weights.   Vitamin D  Deficiency Vitamin D  is at goal of 50.  Most recent vitamin D  level was 80.0. She is at risk for vitamin D  deficiency due to obesity.  She is on MV and vitamin D .  Lab Results  Component Value Date   VD25OH 36 09/08/2016   VD25OH 65 08/04/2013    Plan: Continue vitamin D   Will check for vitamin D  deficiency.   Madison Hardin had a positive depression screening. Depression is commonly associated with obesity and often results in emotional eating behaviors. We will monitor this closely and work on CBT to help improve the non-hunger eating patterns. Referral to Psychology may be required if no improvement is seen as she continues in our clinic.   Eating disorder/emotional eating Madison Hardin has had issues with stress eating, emotional eating, boredom eating, and binge eating. Currently this is moderately controlled. Overall mood is stable. Denies suicidal/homicidal ideation. Medication(s): She took [place] in the past for appetite.  She is not seeing psychiatry as far as I know at this time.  She describes binge eating behaviors.  I do not see an official diagnosis for binge eating.  She states that she can sometimes control it and sometimes she cannot.  Plan:  Motivational interviewing as well as evidence-based interventions for health behavior change were utilized today including the discussion of self monitoring techniques, problem-solving barriers and SMART goal setting  techniques.   Referral to Dr. Sharron, PhD psychologist to help with eating behaviors.  Discussed distractions to curb eating behaviors. Discussed activities to do with one's hands in the evening  Be sure to get adequate rest as lack of rest can trigger appetite.  Have plan in place for stressful events.  Consider other rewards besides food.   Discussed the difference between actually being hungry and having some emotional triggers for eating. Handouts were given and discussed.  Hyperlipidemia LDL is not at goal. Medication(s): Crestor   Cardiovascular risk factors: advanced age (older than 20 for men, 76 for women), dyslipidemia, obesity (BMI >= 30 kg/m2), and sedentary lifestyle  Lab Results  Component Value Date   CHOL 272 (H) 09/08/2016   HDL 58 09/08/2016   LDLCALC 194 (H) 09/08/2016   TRIG 90 09/08/2016   CHOLHDL 4.7 09/08/2016   Lab Results  Component Value Date   ALT 12 04/21/2022   AST 15 04/21/2022   ALKPHOS 70 04/21/2022   BILITOT 0.6 04/21/2022   The ASCVD Risk score (Arnett  DK, et al., 2019) failed to calculate for the following reasons:   The 2019 ASCVD risk score is only valid for ages 60 to 39  Plan:  Continue statin.  Will avoid all trans fats.  Will read labels Will minimize saturated fats except the following: low fat meats in moderation, diary, and limited dark chocolate.    Insomnia Madison Hardin admits to difficulty sleeping and staying asleep. Medication(s): Trazodone   Plan: Continue medications.  Plan: Recommend sleep hygiene measures including regular sleep schedule, optimal sleep environment, and relaxing presleep rituals.  Quick sleep tips Follow these tips to establish healthy sleep habits:  Keep a consistent sleep schedule. Get up at the same time every day, even on weekends or during vacations. Set a bedtime that is early enough for you to get at least 7-8 hours of sleep. Don't go to bed unless you are sleepy. If you don't fall asleep after 20  minutes, get out of bed. Go do a quiet activity without a lot of light exposure. It is especially important to not get on electronics. Establish a relaxing bedtime routine. Use your bed only for sleep and sex. Make your bedroom quiet and relaxing. Keep the room at a comfortable, cool temperature. Limit exposure to bright light in the evenings. Turn off electronic devices at least 30 minutes before bedtime. Don't eat a large meal before bedtime. If you are hungry at night, eat a light, healthy snack. Exercise regularly and maintain a healthy diet. Avoid consuming caffeine in the afternoon or evening. Avoid consuming alcohol before bedtime. Reduce your fluid intake before bedtime.   Previous labs reviewed today. Date: 12/06/23 and 03/02/23 HgbA1c, Vit B12, and BMP  Labs done today CMP, Insulin , Vit D, and Thyroid  Panel   Generalized Obesity: BMI (Calculated): 26.47   Madison Hardin is currently in the action stage of change and her goal is to begin weight loss efforts. I recommend Madison Hardin begin the structured treatment plan as follows:  She has agreed to Category 2 Plan  Exercise goals: Older adults should determine their level of effort for physical activity relative to their level of fitness.  Behavioral modification strategies:increasing lean protein intake, increasing vegetables, increase H2O intake, increase high fiber foods, no skipping meals, meal planning and cooking strategies, keeping healthy foods in the home, avoiding temptations, and planning for success  She was informed of the importance of frequent follow-up visits to maximize her success with intensive lifestyle modifications for her multiple health conditions. She was informed we would discuss her lab results at her next visit unless there is a critical issue that needs to be addressed sooner. Madison Hardin agreed to keep her next visit at the agreed upon time to discuss these results.  Objective:  General: Cooperative, alert, well  developed, in no acute distress. HEENT: Conjunctivae and lids unremarkable. Cardiovascular: Regular rhythm.  Lungs: Normal work of breathing. Neurologic: No focal deficits.   Lab Results  Component Value Date   CREATININE 0.69 04/21/2022   BUN 16 04/21/2022   NA 138 04/21/2022   K 4.2 04/21/2022   CL 104 04/21/2022   CO2 24 04/21/2022   Lab Results  Component Value Date   ALT 12 04/21/2022   AST 15 04/21/2022   ALKPHOS 70 04/21/2022   BILITOT 0.6 04/21/2022   Lab Results  Component Value Date   HGBA1C 5.3 09/08/2016   HGBA1C 5.6 08/04/2013   No results found for: INSULIN  Lab Results  Component Value Date   TSH 1.21 09/08/2016   Lab  Results  Component Value Date   CHOL 272 (H) 09/08/2016   HDL 58 09/08/2016   LDLCALC 194 (H) 09/08/2016   TRIG 90 09/08/2016   CHOLHDL 4.7 09/08/2016   Lab Results  Component Value Date   WBC 6.6 04/21/2022   HGB 14.0 04/21/2022   HCT 42.0 04/21/2022   MCV 92.5 04/21/2022   PLT 258 04/21/2022   No results found for: IRON, TIBC, FERRITIN  Attestation Statements:  Applicable history such as the following:  allergies, medications, problem list, medical history, surgical history, family history, social history, and previous encounter notes reviewed by clinician on day of visit:  Time spent on visit in care of the patient today including the items listed below was 45 minutes.    20 minutes were spent talking about the history, 20 minutes for face to face counseling implementing the plan, discussing the specifics of how to arrange meals, meal planning, water intake.   I spent face to face time discussing his/her plan, including breakfast, additional breakfast options, lunch, and dinner options, grocery list, and snacks.  I reviewed her indirect calorimetry. I discussed the implications for the diet plan.  We discussed the importance of her retaining her muscle mass.   Discussed the bio-impedence test (fat %, muscle mass,  and water weight) and allowed the patient to ask questions.   Discussed the following information sheets: Category 2.   I reviewed the labs which were ordered from  her visit on 07/25, 09/24 through [facility] and discussed with patient.,   I additionally spent time documenting, reviewing, and checking the codes before submitting.   This may have been prepared with the assistance of Engineer, civil (consulting).  Occasional wrong-word or sound-a-like substitutions may have occurred due to the inherent limitations of voice recognition software.    Clayborne Daring, DO

## 2024-01-09 ENCOUNTER — Encounter: Payer: Self-pay | Admitting: *Deleted

## 2024-01-09 LAB — COMPREHENSIVE METABOLIC PANEL WITH GFR
ALT: 22 IU/L (ref 0–32)
AST: 30 IU/L (ref 0–40)
Albumin: 4.3 g/dL (ref 3.7–4.7)
Alkaline Phosphatase: 82 IU/L (ref 44–121)
BUN/Creatinine Ratio: 31 — ABNORMAL HIGH (ref 12–28)
BUN: 22 mg/dL (ref 8–27)
Bilirubin Total: 0.5 mg/dL (ref 0.0–1.2)
CO2: 21 mmol/L (ref 20–29)
Calcium: 9.5 mg/dL (ref 8.7–10.3)
Chloride: 101 mmol/L (ref 96–106)
Creatinine, Ser: 0.7 mg/dL (ref 0.57–1.00)
Globulin, Total: 2.2 g/dL (ref 1.5–4.5)
Glucose: 87 mg/dL (ref 70–99)
Potassium: 4.4 mmol/L (ref 3.5–5.2)
Sodium: 140 mmol/L (ref 134–144)
Total Protein: 6.5 g/dL (ref 6.0–8.5)
eGFR: 86 mL/min/1.73 (ref >59)

## 2024-01-09 LAB — VITAMIN D 25 HYDROXY (VIT D DEFICIENCY, FRACTURES): Vit D, 25-Hydroxy: 53.1 ng/mL (ref 30.0–100.0)

## 2024-01-09 LAB — TSH+T4F+T3FREE
Free T4: 1.32 ng/dL (ref 0.82–1.77)
T3, Free: 2.6 pg/mL (ref 2.0–4.4)
TSH: 1.55 u[IU]/mL (ref 0.450–4.500)

## 2024-01-09 LAB — INSULIN, RANDOM: INSULIN: 8.4 u[IU]/mL (ref 2.6–24.9)

## 2024-01-22 ENCOUNTER — Ambulatory Visit (INDEPENDENT_AMBULATORY_CARE_PROVIDER_SITE_OTHER): Admitting: Bariatrics

## 2024-01-22 ENCOUNTER — Encounter: Payer: Self-pay | Admitting: Bariatrics

## 2024-01-22 VITALS — BP 138/84 | HR 66 | Temp 97.8°F | Ht 61.0 in | Wt 135.0 lb

## 2024-01-22 DIAGNOSIS — R632 Polyphagia: Secondary | ICD-10-CM

## 2024-01-22 DIAGNOSIS — E669 Obesity, unspecified: Secondary | ICD-10-CM | POA: Diagnosis not present

## 2024-01-22 DIAGNOSIS — E785 Hyperlipidemia, unspecified: Secondary | ICD-10-CM | POA: Diagnosis not present

## 2024-01-22 DIAGNOSIS — G4700 Insomnia, unspecified: Secondary | ICD-10-CM

## 2024-01-22 DIAGNOSIS — E7849 Other hyperlipidemia: Secondary | ICD-10-CM

## 2024-01-22 DIAGNOSIS — Z6825 Body mass index (BMI) 25.0-25.9, adult: Secondary | ICD-10-CM

## 2024-01-22 NOTE — Progress Notes (Signed)
 First follow-up after initial visit.        WEIGHT SUMMARY AND BIOMETRICS  Weight Lost Since Last Visit: 5lb  Weight Gained Since Last Visit: 0   Vitals Temp: 97.8 F (36.6 C) BP: 138/84 Pulse Rate: 66 SpO2: 97 %   Anthropometric Measurements Height: 5' 1 (1.549 m) Weight: 135 lb (61.2 kg) BMI (Calculated): 25.52 Weight at Last Visit: 140lb Weight Lost Since Last Visit: 5lb Weight Gained Since Last Visit: 0 Starting Weight: 140lb Total Weight Loss (lbs): 5 lb (2.268 kg) Peak Weight: 303lb   Body Composition  Body Fat %: 40.8 % Fat Mass (lbs): 55.4 lbs Muscle Mass (lbs): 76.2 lbs Total Body Water (lbs): 59.8 lbs Visceral Fat Rating : 12   Other Clinical Data Fasting: yes Labs: no Today's Visit #: 2 Starting Date: 01/08/24    OBESITY Gavriella is here to discuss her progress with her obesity treatment plan along with follow-up of her obesity related diagnoses.    Nutrition Plan: the Category 2 plan - 0% adherence.  Current exercise: none  Interim History:  She is down 5 lbs since her last visit. She has been doing some exercise such as rowing and rowing machine.   Initial positives regarding the dietary plan:  She liked the fact that you can eat anything at any time.  Initial challenges regarding  the dietary plan: she thinks that the list of appetizers.   Pharmacotherapy: Dayleen was on Wegovy in the past but now her BMI is at 71.  She does have a history of obesity.  She struggles with a lot of food noise and with the inability to stay full or to achieve fullness.   Hunger is moderately controlled.  Cravings are moderately controlled.  Assessment/Plan:   Insomnia:  She is sleeping better at this time. She is taking Trazodone  at night.   Plan:  She will continue her Trazodone .   She will monitor her sleep.   Hyperlipidemia LDL is  at goal. Medication(s): Crestor   Cardiovascular risk factors: advanced age (older than 75 for men, 79 for women), obesity (BMI >= 30 kg/m2), and sedentary lifestyle  Lab Results  Component Value Date   CHOL 272 (H) 09/08/2016   HDL 58 09/08/2016   LDLCALC 194 (H) 09/08/2016   TRIG 90 09/08/2016   CHOLHDL 4.7 09/08/2016   Lab Results  Component Value Date   ALT 22 01/08/2024   AST 30 01/08/2024   ALKPHOS 82 01/08/2024   BILITOT 0.5 01/08/2024   The ASCVD Risk score (Arnett DK, et al., 2019) failed to calculate for the following reasons:   The 2019 ASCVD risk score is only valid for ages 69 to 17  Plan:  Continue statin.  Information sheet on healthy vs unhealthy fats.  Will avoid all trans fats.  Will read labels Will minimize saturated fats except the following: low fat meats in moderation, diary, and limited dark chocolate.  Increase Omega 3 in foods, and consider an Omega 3 supplement.   Polyphagia Sayana endorses excessive hunger.  Medication(s): none. She had been on Wegovy in the past. She cannot find a correlation between     Plan: Medication(s): She has been on Wegovy in the past.  May consider Zepbound (self-pay) in the future.  If we do decide on doing Zepbound, we will have to be careful so that she does not lose any significant weight. Will increase water, protein and fiber to help assuage hunger.  Will minimize foods that have a high glucose  index/load to minimize reactive hypoglycemia.    Generalized Obesity: Current BMI BMI (Calculated): 25.52   Pharmacotherapy Plan We may consider Zepbound in the future.  If we do we will consider starting her on the 2.5 of Zepbound and perhaps letting her stay on that dose.  She would be on the medicine not necessarily for weight loss but to help her maintain her weight and to help her minimize her food noise.  Amal is currently in the action stage of change. As such, her goal is to maintain weight for now.  She has  agreed to the Category 2 plan.  Exercise goals: Older adults should determine their level of effort for physical activity relative to their level of fitness.  We discussed her going to the pool for stretching and toning, and also to incorporate some light weights.  She had also been doing some rowing and other exercises but it may have exacerbated some sciatic nerve pain.   Behavioral modification strategies: increasing lean protein intake, decreasing simple carbohydrates , no meal skipping, decrease eating out, meal planning , better snacking choices, planning for success, increasing vegetables, increasing lower sugar fruits, avoiding temptations, keep healthy foods in the home, keep a strict food journal, weigh protein portions, and mindful eating.  [place] has agreed to follow-up with our clinic in 2 weeks.   Labs reviewed today from last visit (CMP, Lipids, HgbA1c, insulin , vitamin D , and thyroid  panel).   Objective:   VITALS: Per patient if applicable, see vitals. GENERAL: Alert and in no acute distress. CARDIOPULMONARY: No increased WOB. Speaking in clear sentences.  PSYCH: Pleasant and cooperative. Speech normal rate and rhythm. Affect is appropriate. Insight and judgement are appropriate. Attention is focused, linear, and appropriate.  NEURO: Oriented as arrived to appointment on time with no prompting.   Attestation Statements:   This was prepared with the assistance of Engineer, civil (consulting).  Occasional wrong-word or sound-a-like substitutions may have occurred due to the inherent limitations of voice recognition software.   Clayborne Daring, DO

## 2024-01-28 ENCOUNTER — Telehealth (INDEPENDENT_AMBULATORY_CARE_PROVIDER_SITE_OTHER): Admitting: Psychology

## 2024-01-28 DIAGNOSIS — F5089 Other specified eating disorder: Secondary | ICD-10-CM | POA: Diagnosis not present

## 2024-01-28 NOTE — Progress Notes (Signed)
 Office: [phone]  /  Fax: [phone]    Date: January 28, 2024    Appointment Start Time: 9:20am Duration: 64 minutes Provider: Wyatt Fire, Psy.D. Type of Session: Intake for Individual Therapy  Location of Patient: Home (private location) Location of Provider: Provider's home (private office) Type of Contact: Telepsychological Visit via MyChart Video Visit  Informed Consent: This provider called Darice at 9:04am as she joined the appointment but did not have video and audio capabilities. Assistance was provided. As such, today's appointment was initiated 20 minutes late. Prior to proceeding with today's appointment, two pieces of identifying information were obtained. In addition, Myrla's physical location at the time of this appointment was obtained as well a phone number she could be reached at in the event of technical difficulties. Estie and this provider participated in today's telepsychological service.   The provider's role was explained to [employer]. The provider reviewed and discussed issues of confidentiality, privacy, and limits therein (e.g., reporting obligations). In addition to verbal informed consent, written informed consent for psychological services was obtained prior to the initial appointment. Since the clinic is not a 24/7 crisis center, mental health emergency resources were shared and this  provider explained MyChart, e-mail, voicemail, and/or other messaging systems should be utilized only for non-emergency reasons. This provider also explained that information obtained during appointments will be placed in Daron's medical record and relevant information will be shared with other providers at [facility] at any locations for coordination of care. Mafalda agreed information may be shared with other [facility] providers as needed for coordination of care and by signing the service agreement document, she provided written consent for  coordination of care. Prior to initiating telepsychological services, Alyne completed an informed consent document, which included the development of a safety plan (i.e., an emergency contact and emergency resources) in the event of an emergency/crisis. Yessenia verbally acknowledged understanding she is ultimately responsible for understanding her insurance benefits for telepsychological and in-person services. This provider also reviewed confidentiality, as it relates to telepsychological services. Reathel  acknowledged understanding that appointments cannot be recorded without both party consent and she is aware she is responsible for securing confidentiality on her end of the session. Jaylin verbally consented to proceed.  Chief Complaint/HPI: Lamyah was referred by Dr. Clayborne Daring on 01/08/2024 due to Eating disorder/emotional eating. Per the note for the OV, Kaeya has had issues with stress eating, emotional eating, boredom eating, and binge eating.Currently this is moderately controlled. Overall mood is stable. Denies suicidal/homicidal ideation.Medication(s): She took [place] in the past for appetite. She is not seeing psychiatry as far as I know at this time.  She describes binge eating behaviors.  I do not see an official diagnosis for binge eating.  She states that she can sometimes control it and sometimes she cannot.  During today's appointment, Ivelis noted a belief she does not engage in emotional eating behaviors, noting That's why I want to talk to you. She was verbally administered a questionnaire assessing various behaviors related to emotional eating behaviors. Jaynia endorsed the following: find food is comforting to you. Of note, she indicated she does not recognize endorsing engagement in emotional eating behaviors as indicated above per the OV with Dr. Daring on 01/08/2024. Tambria explained a hx of taking Wegovy until last December, noting she started engaging in mindless eating starting around  June/July 2025. She expressed feeling she do[es] not have agency as she cannot recall what and when she  is eating. Shaneque explained when the aforementioned occurs, she will graze constantly on real food. More specifically, she will eat something sweet and then savory, which continues as she feels she does not reach satiety. She indicated a belief she is not craving any particular thing. She denied a hx of engagement in compensatory strategies for weight loss, diagnosis of an eating disorder, and tx for disordered eating behaviors. She further explained she works in three paradigms (binge, rapacious hunger, and the good place) and that she does not anticipate them. She indicated she has never been able to correlate the onset of paradigms. She indicated the duration of each paradigm varies from days, weeks, to months. Currently, Charnele stated she is achieving her recommended protein goal, adding her go to foods are eggs and salmon. She indicated she is averaging 850 calories in the past two weeks, which is reportedly below her recommended calorie intake. Moreover, Massiel recalled during freshman year of HS, she started engaging in weight loss efforts secondary to a boy stating she was fat despite her not being fat. During that time, she stopped drinking calories by switching to diet sodas and water. Despite having some beer during her college years, she indicated she did not drink calories. Hadleigh explained she never had a good memory; therefore, she could not identify the onset of the paradigms. Nevertheless, she described fluctuations in her weight for many years and engagement in all or nothing thinking. She could not recall when she first hit obesity, but noticeably began gaining weight during menopause. Furthermore, Shalaine noted a belief she does not eat unless experiencing physical hunger even when engaging in binges.  Mental Status Examination:  Appearance: neat Behavior:  appropriate to circumstances Mood: neutral Affect: mood congruent Speech: WNL Eye Contact: appropriate Psychomotor Activity: WNL Gait: unable to assess  Thought Process: linear, logical, and goal directed and denies suicidal, homicidal, and self-harm ideation, plan and intent  Thought Content/Perception: no hallucinations, delusions, bizarre thinking or behavior endorsed or observed Orientation: AAOx4 Memory/Concentration: intact Insight/Judgment: fair  Family & Psychosocial History: Inaya reported she has a partner/ best friend and she does not have any children. 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 Additionally, Mahagony shared her highest level of education obtained is a master's degree. Currently, Everli's social support system consists of her partner. Moreover, Ameena stated she resides alone.  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   Osteoarthritis    Pneumonia    as child   Past Surgical History:  Procedure Laterality Date   ABDOMINAL HYSTERECTOMY     AUGMENTATION MAMMAPLASTY     Pt had explants   BREAST BIOPSY Left 04/24/2023   MM LT BREAST BX W LOC DEV 1ST LESION IMAGE BX SPEC STEREO GUIDE 04/24/2023 [facility]   BREAST EXCISIONAL BIOPSY Left    BREAST LUMPECTOMY     BREAST LUMPECTOMY WITH RADIOACTIVE SEED AND SENTINEL  LYMPH NODE BIOPSY Left 06/17/2018   Procedure: LEFT BREAST PARTIAL MASTECTOMY WITH RADIOACTIVE SEED AND SENTINEL LYMPH NODE  BIOPSY;  Surgeon: Vernetta Berg, MD;  Location: [facility] OR;  Service: General;  Laterality: Left;   BREAST SURGERY     COLONOSCOPY     last 2010 in [place]   EYE SURGERY     cataracts   KIDNEY STONE SURGERY     flushed per pt.   POLYPECTOMY     last 2010   TONSILLECTOMY     UPPER GASTROINTESTINAL ENDOSCOPY     UTERINE FIBROID SURGERY  1980   Current Outpatient Medications on File Prior to Visit  Medication Sig Dispense Refill   aspirin  EC 81 MG tablet Take 1 tablet (81 mg total) by mouth daily. Swallow whole.     ezetimibe  (ZETIA ) 10 MG tablet Take 1 tablet (10 mg total) by mouth daily. 90 tablet 3   omeprazole  (PRILOSEC) 10 MG capsule Take 1 capsule (10 mg total) by mouth daily. 90 capsule 3   rosuvastatin  (CRESTOR ) 40 MG tablet Take 1 tablet (40 mg total) by mouth daily. 90 tablet 3   trazodone  (DESYREL ) 300 MG tablet Take 300 mg by mouth daily.     No current facility-administered medications on file prior to visit.  Khaya stated she is medication compliant.   Mental Health History: Landree reported her mental health hx is significant for talking to somebody 50-60 years ago. She stated she is currently prescribed Trazodone  for sleep by her PCP. She indicated she averages approximately 7.5 hours with the rx. Avonelle reported there is no history of hospitalizations for psychiatric concerns. She denied a family history of mental health/substance abuse related concerns. Furthermore, Atonya reported there is no history of trauma including psychological, physical , and sexual abuse, as well as neglect.   Sally-Ann described her typical mood lately as mellow, adding I don't have moods. She further shared, My world is small and her days are the same. Vyla stated her response time is slowed, noting it takes [her] awhile to process things. Karson denied all illicit/recreational substance use. Furthermore, Sundy indicated she is not experiencing the following: hallucinations and  delusions, paranoia, symptoms of mania , social withdrawal, crying spells, panic attacks, memory concerns, and attention and concentration issues. She also denied history of and current suicidal ideation, plan, and intent; history of and current homicidal ideation, plan, and intent; and history of and current engagement in self-harm.  Legal History: Karlita reported there is no history of legal involvement.   Structured Assessments Results: The Patient Health Questionnaire-9 (PHQ-9) is a self-report measure that assesses symptoms and severity of depression over the course of the last two weeks. Laurynn obtained a score of 3 suggesting minimal depression. Fraida finds the endorsed symptoms to be somewhat difficult. [0= Not at all; 1= Several days; 2= More than half the days; 3= Nearly every day] Little interest or pleasure in doing things 0  Feeling down, depressed, or hopeless 0  Trouble falling or staying asleep, or sleeping too much-takes Trazodone   0  Feeling tired or having little energy- I'm old and not ambulatory. 3  Poor appetite or overeating 0  Feeling bad about yourself --- or that you are a failure or have let yourself or your family down 0  Trouble concentrating on things, such as reading the newspaper or watching television 0  Moving or speaking so slowly that other people could have noticed? Or the opposite --- being so fidgety or restless  that you have been moving around a lot more than usual 0  Thoughts that you would be better off dead or hurting yourself in some way 0  PHQ-9 Score 3    The Generalized Anxiety Disorder-7 (GAD-7) is a brief self-report measure that assesses symptoms of anxiety over the course of the last two weeks. Miosha obtained a score of 0. [0= Not at all; 1= Several days; 2= Over half the days; 3= Nearly every day] Feeling nervous, anxious, on edge 0  Not being able to stop or control worrying 0  Worrying too much about different things 0  Trouble relaxing 0   Being so restless that it's hard to sit still 0  Becoming easily annoyed or irritable 0  Feeling afraid as if something awful might happen 0  GAD-7 Score 0   Interventions:  Conducted a chart review Focused on rapport building Verbally administered PHQ-9 and GAD-7 for symptom monitoring Verbally administered Food & Mood questionnaire to assess various behaviors related to emotional eating Provided emphatic reflections and validation Psychoeducation provided regarding physical versus emotional hunger  Psychoeducation provided regarding consequences of skipping meals/not eating enough  Diagnostic Impressions & Provisional DSM-5 Diagnosis(es): Johnnisha indicated she does not feel she engages in emotional eating behaviors, rather she grazes when she experiences lack of fullness which she described as binging. During the aforementioned, she also discussed shifting between sweet and savory foods. During today's appointment, she acknowledged she finds food is comforting to her. Chart review disclosed a hx of engagement in emotional eating behaviors. Additionally, she described engaging in all or nothing thinking and disclosed focus on her weight started in high school following a comment made by a peer. She further described shifting between paradigms that have resulted in weight gain and loss over the years. In the past two weeks, she noted insufficient caloric intake (i.e., averaging 850 calories). Phillis denied engagement in any other disordered eating behaviors. 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  Plan: Belladonna appears able and willing to participate as evidenced by engagement in reciprocal conversation and asking questions as needed for clarification. The next appointment is scheduled for 02/18/2024 at 8am, which will be via MyChart Video Visit. The following  treatment goal was established: increase coping skills. This provider will regularly review the treatment plan and medical chart to keep informed of status changes. Winda expressed understanding and agreement with the initial treatment plan of care.   Caili will be sent a handout via e-mail to utilize between now and the next appointment to increase awareness of hunger patterns and subsequent eating. Shaniyah provided verbal consent during today's appointment for this provider to send the handout via e-mail.    Wyatt Fire, PsyD

## 2024-02-01 ENCOUNTER — Telehealth (INDEPENDENT_AMBULATORY_CARE_PROVIDER_SITE_OTHER): Payer: Self-pay | Admitting: Psychology

## 2024-02-01 NOTE — Telephone Encounter (Signed)
  Office: [phone]  /  Fax: [phone]  Date of Call: January 30, 2024 Duration of Call: ~7 minute(s) Provider: Wyatt Fire, PsyD  CONTENT:  This provider sent Dr. Clayborne Daring an email on 01/29/2024 requesting for a call to coordinate care for Darice, as this provider met with Darice for an initial appointment on 01/28/2024. Of note, Tajae provided both verbal and written consent for this provider to coordinate care with Dr. Daring.   Dr. Daring called this provider on 01/30/2024. This provider shared concern regarding Ahniyah's self-report of averaging approximately 850 calories a day for the past two weeks at the time of the initial appointment (01/28/2024) with this provider despite current weight, BMI, and prescribed structured meal plan (Category 2) as noted in the chart. Dr. Daring also expressed concern regarding this level of caloric intake and noted a plan to reinforce the importance of appropriate calorie consumption. Dr. Daring also shared about Lakiya's hx, including weight loss to date with the help of [facility]. She also re-iterated the belief that Glenyce engages in emotional eating behaviors despite self-report during the initial appointment with this provider.   PLAN:  This provider will continue to coordinate care with Dr. Daring as needed.

## 2024-02-06 ENCOUNTER — Encounter: Payer: Self-pay | Admitting: Bariatrics

## 2024-02-06 ENCOUNTER — Ambulatory Visit (INDEPENDENT_AMBULATORY_CARE_PROVIDER_SITE_OTHER): Admitting: Bariatrics

## 2024-02-06 VITALS — BP 117/72 | HR 75 | Temp 97.8°F | Ht 61.0 in | Wt 135.0 lb

## 2024-02-06 DIAGNOSIS — F5089 Other specified eating disorder: Secondary | ICD-10-CM | POA: Diagnosis not present

## 2024-02-06 DIAGNOSIS — Z6825 Body mass index (BMI) 25.0-25.9, adult: Secondary | ICD-10-CM

## 2024-02-06 DIAGNOSIS — E669 Obesity, unspecified: Secondary | ICD-10-CM

## 2024-02-06 DIAGNOSIS — F509 Eating disorder, unspecified: Secondary | ICD-10-CM

## 2024-02-06 NOTE — Progress Notes (Signed)
 WEIGHT SUMMARY AND BIOMETRICS  Weight Lost Since Last Visit: 0  Weight Gained Since Last Visit: 0   Vitals Temp: 97.8 F (36.6 C) BP: 117/72 Pulse Rate: 75 SpO2: 98 %   Anthropometric Measurements Height: 5' 1 (1.549 m) Weight: 135 lb (61.2 kg) BMI (Calculated): 25.52 Weight at Last Visit: 135lb Weight Lost Since Last Visit: 0 Weight Gained Since Last Visit: 0 Starting Weight: 140lb Total Weight Loss (lbs): 5 lb (2.268 kg) Peak Weight: 303lb   Body Composition  Body Fat %: 40.6 % Fat Mass (lbs): 55 lbs Muscle Mass (lbs): 76.2 lbs Total Body Water (lbs): 59.8 lbs Visceral Fat Rating : 12   Other Clinical Data Fasting: yes Labs: no Today's Visit #: 3 Starting Date: 01/08/24    OBESITY Karlisa is here to discuss her progress with her obesity treatment plan along with follow-up of her obesity related diagnoses.    Nutrition Plan: the Category 2 plan - 100% adherence.  Current exercise: Rowing, stretches, recumbent   Interim History:  Her weight remains the same.  Eating all of the food on the plan., Protein intake is as prescribed, Is not skipping meals, and Water intake is adequate.  Hunger is moderately controlled.  Cravings are moderately controlled.  Assessment/Plan:   Eating disorder/emotional eating Rayvin has had issues with stress eating, emotional eating, and boredom eating.  She states that she has not done any binge eating in the last 6 weeks.  She also states that she is attempting to identify when she is hungry and the difference between emotional versus physical hunger. Currently this is moderately controlled. Overall mood is stable. Denies suicidal/homicidal ideation. Medication(s): none  Plan:  Motivational interviewing as well as evidence-based interventions for health behavior change were utilized today including the discussion  of self monitoring techniques, problem-solving barriers and SMART goal setting techniques.  Follow-up with Dr. Sharron, PhD psychologist to help with eating behaviors.  Discussed distractions to curb eating behaviors. Discussed activities to do with one's hands in the evening  Be sure to get adequate rest as lack of rest can trigger appetite.  Have plan in place for stressful events.  Consider other rewards besides food.   Will continue to work on being mindful of her eating behaviors and differentiating between physical versus emotional hunger. She will increase her resistance training and will gradually increase her walking times. She will continue to journal about her perceptions and insights in regard to her eating behaviors.   Generalized Obesity: Current BMI BMI (Calculated): 25.52   Pharmacotherapy Plan She is not taking any antiobesity medications at this time.  AmeLie is currently in the action stage of change. As such, her goal is to continue with weight loss efforts.  She has agreed to the Category 2 plan.  Exercise goals: Older adults should determine their level of effort for physical activity relative to  their level of fitness.   Behavioral modification strategies: increasing lean protein intake, decreasing simple carbohydrates , no meal skipping, decrease eating out, meal planning , increase water intake, better snacking choices, planning for success, increasing vegetables, avoiding temptations, weigh protein portions, and mindful eating.  Lysbeth has agreed to follow-up with our clinic in 3 weeks.    Objective:   VITALS: Per patient if applicable, see vitals. GENERAL: Alert and in no acute distress. CARDIOPULMONARY: No increased WOB. Speaking in clear sentences.  PSYCH: Pleasant and cooperative. Speech normal rate and rhythm. Affect is appropriate. Insight and judgement are appropriate. Attention is focused, linear, and appropriate.  NEURO: Oriented as arrived to  appointment on time with no prompting.   Attestation Statements:   This was prepared with the assistance of Engineer, civil (consulting).  Occasional wrong-word or sound-a-like substitutions may have occurred due to the inherent limitations of voice recognition    Time spent on visit including pre-visit chart review and post-visit charting and care was 34 minutes.    We discussed healthy fruit and the amount of fiber that is needed per day and information sheets were given. We discussed journaling and looking at mindful eating, specifically looking at physical versus emotional eating. We discussed exercise specifically increasing her resistance to 3 days/week, gradually increasing her walking schedule. We discussed protein sources and the principal that she needs to eat at least 80 g of protein per day.   Clayborne Daring, DO

## 2024-02-13 ENCOUNTER — Other Ambulatory Visit: Payer: Self-pay | Admitting: Hematology and Oncology

## 2024-02-13 DIAGNOSIS — N6489 Other specified disorders of breast: Secondary | ICD-10-CM

## 2024-02-18 ENCOUNTER — Telehealth (INDEPENDENT_AMBULATORY_CARE_PROVIDER_SITE_OTHER): Admitting: Psychology

## 2024-03-05 ENCOUNTER — Encounter: Payer: Self-pay | Admitting: Bariatrics

## 2024-03-05 ENCOUNTER — Ambulatory Visit: Admitting: Bariatrics

## 2024-03-05 VITALS — BP 134/77 | HR 82 | Temp 97.6°F | Ht 61.0 in | Wt 133.0 lb

## 2024-03-05 DIAGNOSIS — Z6825 Body mass index (BMI) 25.0-25.9, adult: Secondary | ICD-10-CM

## 2024-03-05 DIAGNOSIS — F5089 Other specified eating disorder: Secondary | ICD-10-CM

## 2024-03-05 DIAGNOSIS — G4700 Insomnia, unspecified: Secondary | ICD-10-CM

## 2024-03-05 DIAGNOSIS — E669 Obesity, unspecified: Secondary | ICD-10-CM | POA: Diagnosis not present

## 2024-03-05 DIAGNOSIS — F509 Eating disorder, unspecified: Secondary | ICD-10-CM

## 2024-03-05 NOTE — Progress Notes (Signed)
 WEIGHT SUMMARY AND BIOMETRICS  Weight Lost Since Last Visit: 2lb  Weight Gained Since Last Visit: 0   Vitals Temp: 97.6 F (36.4 C) BP: 134/77 Pulse Rate: 82 SpO2: 97 %   Anthropometric Measurements Height: 5' 1 (1.549 m) Weight: 133 lb (60.3 kg) BMI (Calculated): 25.14 Weight at Last Visit: 135lb Weight Lost Since Last Visit: 2lb Weight Gained Since Last Visit: 0 Starting Weight: 140lb Total Weight Loss (lbs): 7 lb (3.175 kg) Peak Weight: 303lb   Body Composition  Body Fat %: 39.4 % Fat Mass (lbs): 52.4 lbs Muscle Mass (lbs): 76.4 lbs Total Body Water (lbs): 58 lbs Visceral Fat Rating : 11   Other Clinical Data Fasting: no Labs: no Today's Visit #: 4 Starting Date: 01/08/24    OBESITY Denia is here to discuss her progress with her obesity treatment plan along with follow-up of her obesity related diagnoses.    Nutrition Plan: the Category 2 plan and keeping a food journal with goal of 1200 calories and 90+ grams of protein daily - 90% adherence.  Current exercise: rowing machine.  Interim History:  She is down 2 lbs since her last visit. She is getting 80 grams of protein consistently. She is not seeing Dr. Sharron on a regular basis. She cancelled her appointment as she is not binging.  The bio-impedence scale shows that her body fat is down and her muscle mass is up 0.2 lbs.  Eating all of the food on the plan., Protein intake is as prescribed, Journaling consistently., and Water intake is adequate.   Pharmacotherapy: Margery is not on any anti-obesity medications Hunger is well controlled.  Cravings are well controlled.  Assessment/Plan:   Eating disorder/emotional eating Cardelia has had issues with stress eating, emotional eating, and boredom eating.  Currently this is well controlled. Overall mood is stable. Denies suicidal/homicidal  ideation. Medication(s): none  Plan:  Specifically regarding patient's less desirable eating habits and patterns, we employed the technique of small changes when she cannot fully commit to her prudent nutritional plan. Discussed distractions to curb eating behaviors. Discussed activities to do with one's hands in the evening  Be sure to get adequate rest as lack of rest can trigger appetite.  Have plan in place for stressful events.  Consider other rewards besides food.    Insomnia:   She takes Trazodone  for sleep.   Plan: Will continue the Trazodone .    Generalized Obesity: Current BMI BMI (Calculated): 25.14   Pharmacotherapy Plan  Cerra is currently in the action stage of change. As such, her goal is to continue with weight loss efforts.  She has agreed to the Category 2 plan.  Exercise goals: Older adults should determine their level of effort for physical activity relative to their level of fitness.  She is doing stretches and rowing machine. She is doing PT at times.  Behavioral modification  strategies: increasing lean protein intake, increase water intake, better snacking choices, planning for success, increasing fiber rich foods, and increase frequency of journaling.  Lanissa has agreed to follow-up with our clinic in 4 weeks.      Objective:   VITALS: Per patient if applicable, see vitals. GENERAL: Alert and in no acute distress. CARDIOPULMONARY: No increased WOB. Speaking in clear sentences.  PSYCH: Pleasant and cooperative. Speech normal rate and rhythm. Affect is appropriate. Insight and judgement are appropriate. Attention is focused, linear, and appropriate.  NEURO: Oriented as arrived to appointment on time with no prompting.   Attestation Statements:    Time spent on visit in care of the patient today including the items listed below was 40 minutes.    20 minutes were spent talking about the history, 20 minutes for face to face counseling implementing the  plan, discussing the specifics of how to arrange meals, meal planning, water intake.    Discussed the following information sheets  Healthy and Unhealthy Fats. .   I additionally spent time documenting, reviewing, and checking the codes before submitting.   We discussed her paying attention to her cues for her appetite and cravings. We discussed fiber supplementation, healthy snacks, healthy meals.  We discussed her getting a new scale and weighing herself approximately once every 3 to 4 days.  We looked up scales and decided on a [employer] brand for her.     This was prepared with the assistance of Engineer, civil (consulting).  Occasional wrong-word or sound-a-like substitutions may have occurred due to the inherent limitations of voice recognition   Clayborne Daring, DO

## 2024-03-05 NOTE — Progress Notes (Deleted)
                                                                                                              WEIGHT SUMMARY AND BIOMETRICS  No data recorded No data recorded  No data recorded No data recorded No data recorded No data recorded  OBESITY Madison Hardin is here to discuss her progress with her obesity treatment plan along with follow-up of her obesity related diagnoses.    Nutrition Plan: the Category 2 plan - ***% adherence.  Current exercise: {exercise types:16438}  Interim History:  *** {aabnutritionassessment:29213}   Pharmacotherapy: Madison Hardin is on {dwwpharmacotherapy:29109} Adverse side effects: {dwwse:29122} Hunger is {EWCONTROLASSESSMENT:24261}.  Cravings are {EWCONTROLASSESSMENT:24261}.  Assessment/Plan:   There are no diagnoses linked to this encounter.    {dwwmorbid:29108::Morbid Obesity}: Current BMI No data recorded  Pharmacotherapy Plan {dwwmed:29123}  {dwwpharmacotherapy:29109}  Madison Hardin {CHL AMB IS/IS NOT:210130109} currently in the action stage of change. As such, her goal is to {MWMwtloss#1:210800005}.  She has agreed to {dwwsldiets:29085}.  Exercise goals: {MWM EXERCISE RECS:23473}  Behavioral modification strategies: {dwwslwtlossstrategies:29088}.  Madison Hardin has agreed to follow-up with our clinic in {NUMBER 1-10:22536} weeks.   No orders of the defined types were placed in this encounter.   There are no discontinued medications.   No orders of the defined types were placed in this encounter.     Objective:   VITALS: Per patient if applicable, see vitals. GENERAL: Alert and in no acute distress. CARDIOPULMONARY: No increased WOB. Speaking in clear sentences.  PSYCH: Pleasant and cooperative. Speech normal rate and rhythm. Affect is appropriate. Insight and judgement are appropriate. Attention is focused, linear, and appropriate.  NEURO: Oriented as arrived to appointment on time with no prompting.   Attestation Statements:   This  was prepared with the assistance of Engineer, civil (consulting).  Occasional wrong-word or sound-a-like substitutions may have occurred due to the inherent limitations of voice recognition

## 2024-04-03 ENCOUNTER — Ambulatory Visit: Admitting: Bariatrics

## 2024-04-03 ENCOUNTER — Encounter: Payer: Self-pay | Admitting: Bariatrics

## 2024-04-03 VITALS — BP 143/83 | HR 67 | Ht 61.0 in | Wt 136.0 lb

## 2024-04-03 DIAGNOSIS — F5089 Other specified eating disorder: Secondary | ICD-10-CM

## 2024-04-03 DIAGNOSIS — E669 Obesity, unspecified: Secondary | ICD-10-CM | POA: Diagnosis not present

## 2024-04-03 DIAGNOSIS — Z6825 Body mass index (BMI) 25.0-25.9, adult: Secondary | ICD-10-CM

## 2024-04-03 DIAGNOSIS — F509 Eating disorder, unspecified: Secondary | ICD-10-CM

## 2024-04-03 NOTE — Progress Notes (Signed)
 WEIGHT SUMMARY AND BIOMETRICS  Weight Lost Since Last Visit: 0  Weight Gained Since Last Visit: 3lb   Vitals BP: (!) 143/83 Pulse Rate: 67 SpO2: 95 %   Anthropometric Measurements Height: 5' 1 (1.549 m) Weight: 136 lb (61.7 kg) BMI (Calculated): 25.71 Weight at Last Visit: 133lb Weight Lost Since Last Visit: 0 Weight Gained Since Last Visit: 3lb Starting Weight: 140lb Total Weight Loss (lbs): 4 lb (1.814 kg) Peak Weight: 303lb   Body Composition  Body Fat %: 40.7 % Fat Mass (lbs): 55.6 lbs Muscle Mass (lbs): 76.6 lbs Total Body Water (lbs): 60.6 lbs Visceral Fat Rating : 12   Other Clinical Data Fasting: no Labs: no Today's Visit #: 5 Starting Date: 01/08/24    OBESITY Madison Hardin is here to discuss her progress with her obesity treatment plan along with follow-up of her obesity related diagnoses.    Nutrition Plan: the Category 2 plan and keeping a food journal with goal of 1200 calories and 90+ grams of protein daily - 0% adherence.  Current exercise: none  Interim History:  She is up about 3 lbs since her last visit. Her eating has beenunsettled.  Not eating all of the food on the plan., Protein intake is as prescribed, Is not skipping meals, and Water intake is adequate.   Pharmacotherapy: Madison Hardin is not on any anti-obesity. Hunger is moderately controlled.  Cravings are poorly controlled.  Assessment/Plan:   Eating disorder/emotional eating Madison Hardin has had issues with stress eating, emotional eating, and nighttime eating. Currently this is moderately controlled. Overall mood is stable. Denies suicidal/homicidal ideation. Medication(s): none  Plan:  Motivational interviewing as well as evidence-based interventions for health behavior change were utilized today including the discussion of self monitoring techniques, problem-solving barriers  and SMART goal setting techniques.  Discussed distractions to curb eating behaviors. Discussed activities to do with one's hands in the evening  Be sure to get adequate rest as lack of rest can trigger appetite.  Have plan in place for stressful events.  Consider other rewards besides food.   She will stay on plan but will allow herself an occasional cheat day. She will get back to an exercise regimen at her facility.    Generalized Obesity: Current BMI BMI (Calculated): 25.71   Pharmacotherapy Plan She is not on any anti-obesity medications.   Madison Hardin is currently in the action stage of change. As such, her goal is to continue with weight loss efforts.  She has agreed to the Category 2 plan.  Exercise goals: Older adults should follow the adult guidelines. When older adults cannot meet the adult guidelines, they should be as physically active as their abilities and conditions will allow.   She had been to PT but felt like the exercises were aggravating her pain. She will try some exercises at her facility.   Behavioral modification strategies: increasing lean protein  intake, decreasing simple carbohydrates , no meal skipping, meal planning , increase water intake, better snacking choices, planning for success, increasing vegetables, increasing fiber rich foods, get rid of junk food in the home, avoiding temptations, keep healthy foods in the home, weigh protein portions, work on smaller portions, and mindful eating.  Madison Hardin has agreed to follow-up with our clinic in 4 weeks.     Objective:   VITALS: Per patient if applicable, see vitals. GENERAL: Alert and in no acute distress. CARDIOPULMONARY: No increased WOB. Speaking in clear sentences.  PSYCH: Pleasant and cooperative. Speech normal rate and rhythm. Affect is appropriate. Insight and judgement are appropriate. Attention is focused, linear, and appropriate.  NEURO: Oriented as arrived to appointment on time with no prompting.    Attestation Statements:   This was prepared with the assistance of Engineer, Civil (consulting).  Occasional wrong-word or sound-a-like substitutions may have occurred due to the inherent limitations of voice recognition   Clayborne Daring, DO

## 2024-04-21 ENCOUNTER — Encounter

## 2024-04-24 ENCOUNTER — Ambulatory Visit
Admission: RE | Admit: 2024-04-24 | Discharge: 2024-04-24 | Disposition: A | Discharge status: Home / Self Care | Source: Ambulatory Visit | Attending: Hematology and Oncology | Admitting: Hematology and Oncology

## 2024-04-24 DIAGNOSIS — N6489 Other specified disorders of breast: Secondary | ICD-10-CM

## 2024-04-29 ENCOUNTER — Ambulatory Visit (INDEPENDENT_AMBULATORY_CARE_PROVIDER_SITE_OTHER): Admitting: Bariatrics

## 2024-04-29 ENCOUNTER — Encounter: Payer: Self-pay | Admitting: Bariatrics

## 2024-04-29 VITALS — BP 137/80 | HR 75 | Ht 61.0 in | Wt 140.0 lb

## 2024-04-29 DIAGNOSIS — F509 Eating disorder, unspecified: Secondary | ICD-10-CM

## 2024-04-29 DIAGNOSIS — E669 Obesity, unspecified: Secondary | ICD-10-CM | POA: Diagnosis not present

## 2024-04-29 DIAGNOSIS — Z6826 Body mass index (BMI) 26.0-26.9, adult: Secondary | ICD-10-CM | POA: Diagnosis not present

## 2024-04-29 DIAGNOSIS — F5089 Other specified eating disorder: Secondary | ICD-10-CM

## 2024-04-29 NOTE — Progress Notes (Signed)
 WEIGHT SUMMARY AND BIOMETRICS  Weight Lost Since Last Visit: 0  Weight Gained Since Last Visit: 4lb   Vitals BP: 137/80 Pulse Rate: 75 SpO2: 97 %   Anthropometric Measurements Height: 5' 1 (1.549 m) Weight: 140 lb (63.5 kg) BMI (Calculated): 26.47 Weight at Last Visit: 136lb Weight Lost Since Last Visit: 0 Weight Gained Since Last Visit: 4lb Starting Weight: 140lb Total Weight Loss (lbs): 0 lb (0 kg) Peak Weight: 303lb   Body Composition  Body Fat %: 42.5 % Fat Mass (lbs): 59.6 lbs Muscle Mass (lbs): 76.2 lbs Total Body Water (lbs): 62.4 lbs Visceral Fat Rating : 12   Other Clinical Data Fasting: no Labs: no Today's Visit #: 6 Starting Date: 01/08/24    OBESITY Madison Hardin is here to discuss her progress with her obesity treatment plan along with follow-up of her obesity related diagnoses.    Nutrition Plan: the Category 2 plan and keeping a food journal with goal of 1200 calories and 90+ grams of protein daily - 0% adherence.  Current exercise: none  Interim History:  She is up 4 lbs since her last visit. Overall, she has not lost or gained any weight.  Eating all of the food on the plan., Protein intake is as prescribed, Is skipping meals, Not journaling consistently., and Water intake is adequate.   Pharmacotherapy: Madison Hardin is on no medications at this time.  Hunger is moderately controlled. She is having some difficulty stopping her eating at times.  Cravings are moderately controlled.  Assessment/Plan:   Eating disorder/emotional eating Madison Hardin has had issues with stress eating, emotional eating, and boredom eating. Currently this is moderately controlled. Overall mood is stable. Denies suicidal/homicidal ideation. Medication(s): none  Plan:  Specifically regarding patient's less desirable eating habits and patterns, we employed the technique  of small changes when she cannot fully commit to her prudent nutritional plan.  Discussed distractions to curb eating behaviors. Discussed activities to do with one's hands in the evening  Be sure to get adequate rest as lack of rest can trigger appetite.  Have plan in place for stressful events.  Consider other rewards besides food.     Generalized Obesity: Current BMI BMI (Calculated): 26.47   Pharmacotherapy Plan No anti-obesity medications.   Madison Hardin is currently in the action stage of change. As such, her goal is to continue with weight loss efforts.  She has agreed to the Category 2 plan and keeping a food journal with goal of 1,200 calories and 90 grams of protein daily.  Exercise goals: Older adults should determine their level of effort for physical activity relative to their level of fitness.   Behavioral modification strategies: increasing lean protein intake, decreasing simple carbohydrates , no meal skipping, increase water intake, better snacking choices, planning for success, increasing vegetables, decrease snacking , ways to avoid boredom eating, ways to avoid night time snacking,  avoiding temptations, keep healthy foods in the home, weigh protein portions, and mindful eating.  Madison Hardin has agreed to follow-up with our clinic in 4 weeks.    Medications Discontinued During This Encounter  Medication Reason   gabapentin  (NEURONTIN ) 100 MG capsule Dose change     No orders of the defined types were placed in this encounter.     Objective:   VITALS: Per patient if applicable, see vitals. GENERAL: Alert and in no acute distress. CARDIOPULMONARY: No increased WOB. Speaking in clear sentences.  PSYCH: Pleasant and cooperative. Speech normal rate and rhythm. Affect is appropriate. Insight and judgement are appropriate. Attention is focused, linear, and appropriate.  NEURO: Oriented as arrived to appointment on time with no prompting.   Attestation Statements:   This was  prepared with the assistance of Engineer, Civil (consulting).  Occasional wrong-word or sound-a-like substitutions may have occurred due to the inherent limitations of voice recognition.    Time spent on visit including pre-visit chart review and post-visit charting and care was 35 minutes.    We discussed exercise regimens including using machines and/or light free weights (7 to 8 min . We discussed exercises for her sciatica to minimize pain and to ensure that she can exercise on a regular basis ( 5 to 7 minutes). We discussed healthy and unhealthy fats with a focus on healthy fats to minimize eating at night ( 7 minutes).. We discussed calories and protein and the importance of keeping her calories above the thousand but not so high as to promote weight gain (8 minutes). We discussed the body set point (10 minutes)    Clayborne Daring, DO

## 2024-05-27 ENCOUNTER — Ambulatory Visit (INDEPENDENT_AMBULATORY_CARE_PROVIDER_SITE_OTHER): Admitting: Bariatrics

## 2024-05-27 ENCOUNTER — Encounter: Payer: Self-pay | Admitting: Bariatrics

## 2024-05-27 VITALS — BP 131/74 | HR 74 | Ht 61.0 in | Wt 135.0 lb

## 2024-05-27 DIAGNOSIS — Z6825 Body mass index (BMI) 25.0-25.9, adult: Secondary | ICD-10-CM | POA: Diagnosis not present

## 2024-05-27 DIAGNOSIS — F509 Eating disorder, unspecified: Secondary | ICD-10-CM

## 2024-05-27 DIAGNOSIS — E669 Obesity, unspecified: Secondary | ICD-10-CM | POA: Diagnosis not present

## 2024-05-27 DIAGNOSIS — F5089 Other specified eating disorder: Secondary | ICD-10-CM

## 2024-05-27 NOTE — Progress Notes (Signed)
 "                                                                                                             WEIGHT SUMMARY AND BIOMETRICS  Weight Lost Since Last Visit: 5lb  Weight Gained Since Last Visit: 0   Vitals BP: 131/74 Pulse Rate: 74 SpO2: 96 %   Anthropometric Measurements Height: 5' 1 (1.549 m) Weight: 135 lb (61.2 kg) BMI (Calculated): 25.52 Weight at Last Visit: 140lb Weight Lost Since Last Visit: 5lb Weight Gained Since Last Visit: 0 Starting Weight: 140lb Total Weight Loss (lbs): 5 lb (2.268 kg) Peak Weight: 303lb   Body Composition  Body Fat %: 40.7 % Fat Mass (lbs): 55.2 lbs Muscle Mass (lbs): 76.4 lbs Total Body Water (lbs): 58.2 lbs Visceral Fat Rating : 12   Other Clinical Data Fasting: no Labs: no Today's Visit #: 7 Starting Date: 01/08/24    OBESITY Lennette is here to discuss her progress with her obesity treatment plan along with follow-up of her obesity related diagnoses.    Nutrition Plan: the Category 2 plan and keeping a food journal with goal of 1200 calories and 90 grams of protein daily - 50% adherence.  Current exercise: Pool, stepper, rowing   Interim History:  She is down 5 lbs since her last visit. She has been eating more calories., and then her appetite decreased.  Eating all of the food on the plan., Protein intake is as prescribed, and Water intake is adequate.   Pharmacotherapy: Deaun is not on anti-obesity medications.  Hunger is moderately controlled.  Cravings are moderately controlled.  Assessment/Plan:   Eating disorder/emotional eating Karalina has had issues with stress eating, emotional eating, and boredom eating. Currently this is moderately controlled. Overall mood is stable. Denies suicidal/homicidal ideation. Medication(s): none  Plan:  Specifically regarding patient's less desirable eating habits and patterns, we employed the technique of small changes when she cannot fully commit to her prudent  nutritional plan. Medication(s): none Discussed distractions to curb eating behaviors. Discussed activities to do with one's hands in the evening  Be sure to get adequate rest as lack of rest can trigger appetite.  Have plan in place for stressful events.  Consider other rewards besides food.   Will continue to keep her water, fiber, and protein high.  Will continue her varied and sundry exercises.    Generalized Obesity: Current BMI BMI (Calculated): 25.52   Pharmacotherapy Plan No anti-obesity medications.   Scarlettrose is currently in the action stage of change. As such, her goal is to continue with weight loss efforts.  She has agreed to keeping a food journal with goal of 1,200 calories and 90 grams of protein daily.  Exercise goals: Older adults should determine their level of effort for physical activity relative to their level of fitness.  She is working out daily on the recumbent stepper and working out in the pool.   Behavioral modification strategies: increasing lean protein intake, decrease eating out, decrease liquid calories, increase water intake, better snacking choices, planning  for success, increasing vegetables, increasing fiber rich foods, emotional eating strategies, decrease snacking , keep healthy foods in the home, holiday eating strategies , weigh protein portions, and measure portion sizes.  Daleigh has agreed to follow-up with our clinic in 4 weeks.     Objective:   VITALS: Per patient if applicable, see vitals. GENERAL: Alert and in no acute distress. CARDIOPULMONARY: No increased WOB. Speaking in clear sentences.  PSYCH: Pleasant and cooperative. Speech normal rate and rhythm. Affect is appropriate. Insight and judgement are appropriate. Attention is focused, linear, and appropriate.  NEURO: Oriented as arrived to appointment on time with no prompting.   Attestation Statements:   This was prepared with the assistance of Engineer, Civil (consulting).  Occasional  wrong-word or sound-a-like substitutions may have occurred due to the inherent limitations of voice recognition.   Clayborne Daring, DO   "

## 2024-06-24 ENCOUNTER — Encounter: Payer: Self-pay | Admitting: Bariatrics

## 2024-06-24 ENCOUNTER — Ambulatory Visit: Admitting: Bariatrics

## 2024-06-24 VITALS — BP 143/85 | HR 65 | Ht 61.0 in | Wt 144.0 lb

## 2024-06-24 DIAGNOSIS — F5089 Other specified eating disorder: Secondary | ICD-10-CM | POA: Diagnosis not present

## 2024-06-24 DIAGNOSIS — E7849 Other hyperlipidemia: Secondary | ICD-10-CM

## 2024-06-24 DIAGNOSIS — E785 Hyperlipidemia, unspecified: Secondary | ICD-10-CM

## 2024-06-24 DIAGNOSIS — F509 Eating disorder, unspecified: Secondary | ICD-10-CM

## 2024-06-24 DIAGNOSIS — Z6827 Body mass index (BMI) 27.0-27.9, adult: Secondary | ICD-10-CM | POA: Diagnosis not present

## 2024-06-24 DIAGNOSIS — E669 Obesity, unspecified: Secondary | ICD-10-CM | POA: Diagnosis not present

## 2024-06-24 MED ORDER — WEGOVY 0.25 MG/0.5ML ~~LOC~~ SOAJ: 0.2500 mg | SUBCUTANEOUS | 0 refills | Status: AC

## 2024-06-24 NOTE — Progress Notes (Signed)
 "                                                                                                             WEIGHT SUMMARY AND BIOMETRICS  Weight Lost Since Last Visit: 0  Weight Gained Since Last Visit: 9lb   Vitals BP: (!) 143/85 Pulse Rate: 65 SpO2: 98 %   Anthropometric Measurements Height: 5' 1 (1.549 m) Weight: 144 lb (65.3 kg) BMI (Calculated): 27.22 Weight at Last Visit: 135lb Weight Lost Since Last Visit: 0 Weight Gained Since Last Visit: 9lb Starting Weight: 140lb Total Weight Loss (lbs): 0 lb (0 kg) Peak Weight: 303lb   Body Composition  Body Fat %: 44 % Fat Mass (lbs): 63.6 lbs Muscle Mass (lbs): 76.8 lbs Total Body Water (lbs): 65 lbs Visceral Fat Rating : 13   Other Clinical Data Fasting: no Labs: no Today's Visit #: 8 Starting Date: 01/08/24    OBESITY Madison Hardin is here to discuss her progress with her obesity treatment plan along with follow-up of her obesity related diagnoses.    Nutrition Plan: the Category 2 plan - 0% adherence.  Current exercise: none  Interim History:  She is up 9 lbs since her last visit.  Protein intake is as prescribed, Is not skipping meals, Not journaling consistently., and Water intake is adequate.   Pharmacotherapy: Madison Hardin is not on any anti-obesity medications.  Hunger is poorly controlled.  Cravings are moderately controlled.  Assessment/Plan:   Eating disorder/emotional eating Madison Hardin has had issues with stress eating, emotional eating, and boredom eating. Currently this is poorly controlled. Overall mood is stable. Denies suicidal/homicidal ideation. Medication(s): none for craving, but has taken Wegovy  in the past.   Plan:  Discussed cues and consequences, how thoughts affect eating, model of thoughts, feelings, and behaviors, and strategies for change by focusing on the cue. Discussed cognitive distortions, coping thoughts, and how to change your thoughts. Medication(s): Wegovy  0.25 mg weekly.   Discussed distractions to curb eating behaviors. Discussed activities to do with one's hands in the evening  Be sure to get adequate rest as lack of rest can trigger appetite.  Have plan in place for stressful events.  Consider other rewards besides food.   Will continue cardio and resistance to maintain muscle.  She will walk to tolerance.  Madison Hardin denies personal or family history of thyroid  cancer, history of pancreatitis, or current cholelithiasis. Madison Hardin was informed of the most common side effects (nausea, constipation, diarrhea). She was given GLP-1 information sheet.  She has been placed on a 500 calorie deficit diet.  She has been advised to exercise at least 150 minutes per week, both cardio and resistance.    Hyperlipidemia LDL is not at goal. Medication(s): Crestor  Cardiovascular risk factors: advanced age (older than 9 for men, 22 for women), obesity (BMI >= 30 kg/m2), and sedentary lifestyle  Lab Results  Component Value Date   CHOL 272 (H) 09/08/2016   HDL 58 09/08/2016   LDLCALC 194 (H) 09/08/2016   TRIG 90 09/08/2016   CHOLHDL 4.7 09/08/2016  Lab Results  Component Value Date   ALT 22 01/08/2024   AST 30 01/08/2024   ALKPHOS 82 01/08/2024   BILITOT 0.5 01/08/2024   The ASCVD Risk score (Arnett DK, et al., 2019) failed to calculate for the following reasons:   The 2019 ASCVD risk score is only valid for ages 3 to 37   * - Cholesterol units were assumed  Plan:  Continue statin.  Will increase exercise, both cardio and resistance. Will avoid all trans fats.  Will read labels Will minimize saturated fats except the following: low fat meats in moderation, diary, and limited dark chocolate.       Generalized Obesity: Current BMI BMI (Calculated): 27.22   Pharmacotherapy Plan Start  Wegovy  0.25 mg SQ weekly  Madison Hardin is currently in the action stage of change. As such, her goal is to continue with weight loss efforts.  She has agreed to the Category 2  plan.  Exercise goals: Older adults should determine their level of effort for physical activity relative to their level of fitness.   Behavioral modification strategies: increasing lean protein intake, no meal skipping, meal planning , increase water intake, better snacking choices, planning for success, increasing fiber rich foods, get rid of junk food in the home, decrease snacking , avoiding temptations, and keep healthy foods in the home.  Madison Hardin has agreed to follow-up with our clinic in 6 weeks.    Objective:   VITALS: Per patient if applicable, see vitals. GENERAL: Alert and in no acute distress. CARDIOPULMONARY: No increased WOB. Speaking in clear sentences.  PSYCH: Pleasant and cooperative. Speech normal rate and rhythm. Affect is appropriate. Insight and judgement are appropriate. Attention is focused, linear, and appropriate.  NEURO: Oriented as arrived to appointment on time with no prompting.   Attestation Statements:   This was prepared with the assistance of Engineer, Civil (consulting).  Occasional wrong-word or sound-a-like substitutions may have occurred due to the inherent limitations of voice recognition.   Clayborne Daring, DO   "

## 2024-07-09 ENCOUNTER — Encounter: Payer: Self-pay | Admitting: Cardiology

## 2024-07-09 ENCOUNTER — Ambulatory Visit: Admitting: Cardiology

## 2024-07-09 VITALS — BP 130/70 | HR 77 | Ht 61.0 in | Wt 149.0 lb

## 2024-07-09 DIAGNOSIS — E785 Hyperlipidemia, unspecified: Secondary | ICD-10-CM | POA: Diagnosis not present

## 2024-07-09 DIAGNOSIS — I10 Essential (primary) hypertension: Secondary | ICD-10-CM

## 2024-07-09 DIAGNOSIS — I2510 Atherosclerotic heart disease of native coronary artery without angina pectoris: Secondary | ICD-10-CM

## 2024-07-09 NOTE — Patient Instructions (Signed)
  Follow-Up: At [facility], you and your health needs are our priority.  As part of our continuing mission to provide you with exceptional heart care, our providers are all part of one team.  This team includes your primary Cardiologist (physician) and Advanced Practice Providers or APPs (Physician Assistants and Nurse Practitioners) who all work together to provide you with the care you need, when you need it.  Your next appointment:   12 month(s)  Provider:   Alexandria Angel MD

## 2024-07-11 ENCOUNTER — Encounter: Payer: Self-pay | Admitting: Cardiology

## 2024-07-29 ENCOUNTER — Ambulatory Visit: Admitting: Bariatrics
